# Patient Record
Sex: Female | Born: 1955 | Race: Black or African American | Hispanic: No | Smoking: Never smoker
Health system: Southern US, Community
[De-identification: ages and names within clinical notes are randomized; demographics above are authoritative.]

## PROBLEM LIST (undated history)

## (undated) DIAGNOSIS — E785 Hyperlipidemia, unspecified: Secondary | ICD-10-CM

## (undated) DIAGNOSIS — N390 Urinary tract infection, site not specified: Secondary | ICD-10-CM

## (undated) DIAGNOSIS — B019 Varicella without complication: Secondary | ICD-10-CM

## (undated) DIAGNOSIS — F32A Depression, unspecified: Secondary | ICD-10-CM

## (undated) DIAGNOSIS — F329 Major depressive disorder, single episode, unspecified: Secondary | ICD-10-CM

## (undated) HISTORY — DX: Hyperlipidemia, unspecified: E78.5

## (undated) HISTORY — DX: Urinary tract infection, site not specified: N39.0

## (undated) HISTORY — DX: Varicella without complication: B01.9

## (undated) HISTORY — DX: Depression, unspecified: F32.A

## (undated) HISTORY — PX: MYOMECTOMY: SHX85

## (undated) HISTORY — DX: Major depressive disorder, single episode, unspecified: F32.9

---

## 2015-07-05 DIAGNOSIS — J301 Allergic rhinitis due to pollen: Secondary | ICD-10-CM | POA: Insufficient documentation

## 2016-08-22 DIAGNOSIS — G4700 Insomnia, unspecified: Secondary | ICD-10-CM | POA: Insufficient documentation

## 2016-12-05 ENCOUNTER — Encounter: Payer: Self-pay | Admitting: Family Medicine

## 2016-12-11 ENCOUNTER — Ambulatory Visit: Payer: Self-pay | Admitting: Family Medicine

## 2016-12-13 ENCOUNTER — Encounter: Payer: Self-pay | Admitting: Family Medicine

## 2016-12-13 ENCOUNTER — Ambulatory Visit: Payer: Self-pay | Admitting: Family Medicine

## 2016-12-13 ENCOUNTER — Ambulatory Visit: Payer: BLUE CROSS/BLUE SHIELD | Admitting: Family Medicine

## 2016-12-13 VITALS — BP 128/80 | HR 71 | Temp 97.7°F | Ht 66.0 in | Wt 159.1 lb

## 2016-12-13 DIAGNOSIS — Z0000 Encounter for general adult medical examination without abnormal findings: Secondary | ICD-10-CM | POA: Diagnosis not present

## 2016-12-13 DIAGNOSIS — Z1231 Encounter for screening mammogram for malignant neoplasm of breast: Secondary | ICD-10-CM | POA: Diagnosis not present

## 2016-12-13 DIAGNOSIS — J3089 Other allergic rhinitis: Secondary | ICD-10-CM

## 2016-12-13 DIAGNOSIS — Z1239 Encounter for other screening for malignant neoplasm of breast: Secondary | ICD-10-CM

## 2016-12-13 DIAGNOSIS — F320 Major depressive disorder, single episode, mild: Secondary | ICD-10-CM | POA: Insufficient documentation

## 2016-12-13 DIAGNOSIS — Z23 Encounter for immunization: Secondary | ICD-10-CM | POA: Insufficient documentation

## 2016-12-13 DIAGNOSIS — E559 Vitamin D deficiency, unspecified: Secondary | ICD-10-CM | POA: Diagnosis not present

## 2016-12-13 DIAGNOSIS — G4709 Other insomnia: Secondary | ICD-10-CM | POA: Insufficient documentation

## 2016-12-13 DIAGNOSIS — Z1211 Encounter for screening for malignant neoplasm of colon: Secondary | ICD-10-CM | POA: Insufficient documentation

## 2016-12-13 DIAGNOSIS — J309 Allergic rhinitis, unspecified: Secondary | ICD-10-CM | POA: Insufficient documentation

## 2016-12-13 LAB — URINALYSIS, ROUTINE W REFLEX MICROSCOPIC
Bilirubin Urine: NEGATIVE
Hgb urine dipstick: NEGATIVE
Ketones, ur: NEGATIVE
Leukocytes, UA: NEGATIVE
Nitrite: NEGATIVE
RBC / HPF: NONE SEEN (ref 0–?)
SPECIFIC GRAVITY, URINE: 1.015 (ref 1.000–1.030)
TOTAL PROTEIN, URINE-UPE24: NEGATIVE
Urine Glucose: NEGATIVE
Urobilinogen, UA: 0.2 (ref 0.0–1.0)
WBC, UA: NONE SEEN (ref 0–?)
pH: 7.5 (ref 5.0–8.0)

## 2016-12-13 LAB — CBC
HEMATOCRIT: 43.8 % (ref 36.0–46.0)
Hemoglobin: 14.3 g/dL (ref 12.0–15.0)
MCHC: 32.6 g/dL (ref 30.0–36.0)
MCV: 92.3 fl (ref 78.0–100.0)
Platelets: 232 10*3/uL (ref 150.0–400.0)
RBC: 4.74 Mil/uL (ref 3.87–5.11)
RDW: 13.3 % (ref 11.5–15.5)
WBC: 3.9 10*3/uL — AB (ref 4.0–10.5)

## 2016-12-13 LAB — COMPREHENSIVE METABOLIC PANEL
ALT: 18 U/L (ref 0–35)
AST: 21 U/L (ref 0–37)
Albumin: 4.4 g/dL (ref 3.5–5.2)
Alkaline Phosphatase: 113 U/L (ref 39–117)
BUN: 12 mg/dL (ref 6–23)
CHLORIDE: 104 meq/L (ref 96–112)
CO2: 28 meq/L (ref 19–32)
Calcium: 11 mg/dL — ABNORMAL HIGH (ref 8.4–10.5)
Creatinine, Ser: 0.65 mg/dL (ref 0.40–1.20)
GFR: 118.75 mL/min (ref >60.00)
GLUCOSE: 95 mg/dL (ref 70–99)
POTASSIUM: 4.3 meq/L (ref 3.5–5.1)
SODIUM: 139 meq/L (ref 135–145)
TOTAL PROTEIN: 7.4 g/dL (ref 6.0–8.3)
Total Bilirubin: 0.8 mg/dL (ref 0.2–1.2)

## 2016-12-13 LAB — LIPID PANEL
Cholesterol: 160 mg/dL (ref 0–200)
HDL: 45 mg/dL (ref >39.00)
LDL CALC: 95 mg/dL (ref 0–99)
NONHDL: 115.29
Total CHOL/HDL Ratio: 4
Triglycerides: 99 mg/dL (ref 0.0–149.0)
VLDL: 19.8 mg/dL (ref 0.0–40.0)

## 2016-12-13 LAB — VITAMIN D 25 HYDROXY (VIT D DEFICIENCY, FRACTURES): VITD: 22.94 ng/mL — AB (ref 30.00–100.00)

## 2016-12-13 LAB — TSH: TSH: 2.07 u[IU]/mL (ref 0.35–4.50)

## 2016-12-13 MED ORDER — PAROXETINE HCL 10 MG PO TABS: ORAL_TABLET | ORAL | 1 refills | Status: DC

## 2016-12-13 MED ORDER — TRAZODONE HCL 100 MG PO TABS: ORAL_TABLET | ORAL | 1 refills | Status: DC

## 2016-12-13 NOTE — Addendum Note (Signed)
Addended by: Kateri Mc E on: 12/13/2016 03:51 PM   Modules accepted: Orders

## 2016-12-13 NOTE — Progress Notes (Addendum)
Subjective:  Patient ID: Vicki George, female    DOB: 30-Jun-1955  Age: 61 y.o. MRN: [id number]  CC: [place] presents today to establish care and for follow-up of her insomnia and allergic rhinitis.  She continues to have sneezing, itchy watery eyes, and postnasal drip.  She had visited her her sister up [place] a few weeks ago and found relief from an over-the-counter product.  She is a Regulatory affairs officer.  There is a lot of cough in her house but it is located mostly in her basement.  She has mattress covers.  She also continues to suffer insomnia.  She tells of some difficulty going to sleep and staying asleep.  She also admits to sadness.  Her energy level has been affected.  Her work is not been affected.  Friends have suggested that she seek help.  She denies any thoughts of self-harm.  She lives alone.  She does not smoke drink alcohol or use illicit drugs.  She does not have a faith community at this time.  Her only son is a Administrator, arts at [employer].  He is studying Librarian, academic.  She expresses concern about his apparent lack of motivation but he told her that he is making A's and B's.  He refuses to get his driver's license.  She is in need of a Pap smear, colonoscopy, and mammogram.  She is fasting today.  See also PHQ 9 obtained today.  HPI Vicki George presents for  History Georgian has no past medical history on file.   She has no past surgical history on file.   Her family history is not on file.She reports that  has never smoked. she has never used smokeless tobacco. She reports that she does not drink alcohol or use drugs.  No outpatient medications prior to visit.   No facility-administered medications prior to visit.     ROS Review of Systems  Constitutional: Negative.   HENT: Positive for postnasal drip, rhinorrhea and sneezing. Negative for congestion, ear pain, hearing loss, nosebleeds, sore throat, tinnitus, trouble swallowing and voice change.   Eyes: Positive for  itching. Negative for pain and visual disturbance.  Respiratory: Negative.   Cardiovascular: Negative.   Gastrointestinal: Negative.   Endocrine: Negative for polyphagia and polyuria.  Genitourinary: Negative.   Musculoskeletal: Negative.   Skin: Negative for color change and rash.  Allergic/Immunologic: Positive for environmental allergies.  Neurological: Negative.   Hematological: Does not bruise/bleed easily.  Psychiatric/Behavioral: Positive for dysphoric mood and sleep disturbance. Negative for agitation, behavioral problems, confusion, self-injury and suicidal ideas. The patient is nervous/anxious.     Objective:  BP 128/80 (BP Location: Left Arm, Patient Position: Sitting, Cuff Size: Normal)   Pulse 71   Temp 97.7 F (36.5 C) (Oral)   Ht 5\' 6"  (1.676 m)   Wt 159 lb 2 oz (72.2 kg)   SpO2 98%   BMI 25.68 kg/m   Physical Exam  Constitutional: She is oriented to person, place, and time. She appears well-developed and well-nourished. No distress.  HENT:  Head: Normocephalic and atraumatic.  Right Ear: External ear normal.  Left Ear: External ear normal.  Mouth/Throat: Oropharynx is clear and moist. No oropharyngeal exudate.  Eyes: Conjunctivae are normal. Pupils are equal, round, and reactive to light. Right eye exhibits no discharge. Left eye exhibits no discharge. No scleral icterus.  Neck: Neck supple. No JVD present. No tracheal deviation present. No thyromegaly present.  Cardiovascular: Normal rate, regular rhythm and normal heart sounds.  Pulmonary/Chest:  Effort normal and breath sounds normal. No stridor.  Abdominal: Bowel sounds are normal.  Musculoskeletal: She exhibits no edema or tenderness.  Lymphadenopathy:    She has no cervical adenopathy.  Neurological: She is alert and oriented to person, place, and time.  Skin: Skin is warm and dry. She is not diaphoretic.  Psychiatric: She has a normal mood and affect. Her behavior is normal.      Assessment & Plan:    Zhania was seen today for establish care.  Diagnoses and all orders for this visit:  Depression, major, single episode, mild (HCC) -     TSH -     PARoxetine (PAXIL) 10 MG tablet; Take one daily for one week and then take 2 -     traZODone (DESYREL) 100 MG tablet; Take 1/2 to 1 at night as needed for sleep.  Other insomnia -     PARoxetine (PAXIL) 10 MG tablet; Take one daily for one week and then take 2 -     traZODone (DESYREL) 100 MG tablet; Take 1/2 to 1 at night as needed for sleep.  Healthcare maintenance -     CBC -     Comprehensive metabolic panel -     Hepatitis C antibody -     HIV antibody -     Lipid panel -     TSH -     Urinalysis, Routine w reflex microscopic -     VITAMIN D 25 Hydroxy (Vit-D Deficiency, Fractures)  Screening for breast cancer -     Cancel: MM Digital Screening; Future -     MM Digital Screening; Future  Need for influenza vaccination -     Flu Vaccine QUAD 36+ mos IM  Annual physical exam  Non-seasonal allergic rhinitis due to other allergic trigger  Vitamin D deficiency -     Vitamin D, Ergocalciferol, (DRISDOL) 50000 units CAPS capsule; Take 1 capsule (50,000 Units total) by mouth every 7 (seven) days.   I am having [facility] start on PARoxetine, traZODone, and Vitamin D (Ergocalciferol).  Meds ordered this encounter  Medications  . PARoxetine (PAXIL) 10 MG tablet    Sig: Take one daily for one week and then take 2    Dispense:  60 tablet    Refill:  1  . traZODone (DESYREL) 100 MG tablet    Sig: Take 1/2 to 1 at night as needed for sleep.    Dispense:  60 tablet    Refill:  1  . Vitamin D, Ergocalciferol, (DRISDOL) 50000 units CAPS capsule    Sig: Take 1 capsule (50,000 Units total) by mouth every 7 (seven) days.    Dispense:  30 capsule    Refill:  1     Follow-up: Return in about 6 weeks (around 01/24/2017).  Libby Maw, MD

## 2016-12-14 ENCOUNTER — Encounter: Payer: Self-pay | Admitting: Family Medicine

## 2016-12-14 DIAGNOSIS — E559 Vitamin D deficiency, unspecified: Secondary | ICD-10-CM | POA: Insufficient documentation

## 2016-12-14 LAB — HEPATITIS C ANTIBODY
Hepatitis C Ab: NONREACTIVE
SIGNAL TO CUT-OFF: 0.02 (ref <1.00)

## 2016-12-14 LAB — HIV ANTIBODY (ROUTINE TESTING W REFLEX): HIV 1&2 Ab, 4th Generation: NONREACTIVE

## 2016-12-14 MED ORDER — VITAMIN D (ERGOCALCIFEROL) 1.25 MG (50000 UNIT) PO CAPS: 50000.0000 [IU] | ORAL_CAPSULE | ORAL | 1 refills | Status: DC

## 2016-12-14 NOTE — Addendum Note (Signed)
Addended by: Jon Billings on: 12/14/2016 08:09 AM   Modules accepted: Orders

## 2017-02-26 ENCOUNTER — Ambulatory Visit: Payer: BLUE CROSS/BLUE SHIELD | Admitting: Family Medicine

## 2017-02-26 DIAGNOSIS — Z0289 Encounter for other administrative examinations: Secondary | ICD-10-CM

## 2017-03-23 ENCOUNTER — Ambulatory Visit: Payer: BLUE CROSS/BLUE SHIELD | Admitting: Family Medicine

## 2017-03-23 ENCOUNTER — Encounter: Payer: Self-pay | Admitting: Family Medicine

## 2017-03-23 VITALS — BP 120/80 | HR 84 | Ht 66.0 in | Wt 160.6 lb

## 2017-03-23 DIAGNOSIS — G4700 Insomnia, unspecified: Secondary | ICD-10-CM | POA: Diagnosis not present

## 2017-03-23 DIAGNOSIS — F320 Major depressive disorder, single episode, mild: Secondary | ICD-10-CM | POA: Diagnosis not present

## 2017-03-23 MED ORDER — PAROXETINE HCL 20 MG PO TABS: 20.0000 mg | ORAL_TABLET | Freq: Every day | ORAL | 1 refills | Status: DC

## 2017-03-23 NOTE — Progress Notes (Addendum)
Subjective:  Patient ID: Vicki George, female    DOB: 10-May-1955  Age: 62 y.o. MRN: [id number]  CC: Follow-up   HPI Vicki George presents for patient feels much better having taking the Paxil.  Has been lifted.  And she is definitely interested in continuing it.  Anxiety issues and sleep are much better.  She recently did start the vitamin D supplementation.  Unfortunately she lost a younger brother back in her home country but had an MVA involving an 69 wheeler.  Outpatient Medications Prior to Visit  Medication Sig Dispense Refill  . Vitamin D, Ergocalciferol, (DRISDOL) 50000 units CAPS capsule Take 1 capsule (50,000 Units total) by mouth every 7 (seven) days. 30 capsule 1  . PARoxetine (PAXIL) 10 MG tablet Take one daily for one week and then take 2 60 tablet 1  . traZODone (DESYREL) 100 MG tablet Take 1/2 to 1 at night as needed for sleep. (Patient not taking: Reported on 03/23/2017) 60 tablet 1   No facility-administered medications prior to visit.     ROS Review of Systems  Constitutional: Negative.   Respiratory: Negative.   Cardiovascular: Negative.   Gastrointestinal: Negative.   Neurological: Negative.   Hematological: Negative.   Psychiatric/Behavioral: Negative for behavioral problems, self-injury and suicidal ideas.    Objective:  BP 120/80 (BP Location: Left Arm, Patient Position: Sitting, Cuff Size: Normal)   Pulse 84   Ht 5\' 6"  (1.676 m)   Wt 160 lb 9.6 oz (72.8 kg)   BMI 25.92 kg/m   BP Readings from Last 3 Encounters:  09/07/17 110/70  03/23/17 120/80  12/13/16 128/80    Wt Readings from Last 3 Encounters:  09/07/17 161 lb 2 oz (73.1 kg)  03/23/17 160 lb 9.6 oz (72.8 kg)  12/13/16 159 lb 2 oz (72.2 kg)    Physical Exam  Constitutional: She is oriented to person, place, and time. She appears well-nourished. No distress.  HENT:  Head: Normocephalic and atraumatic.  Right Ear: External ear normal.  Left Ear: External ear normal.  Pulmonary/Chest:  Effort normal.  Neurological: She is alert and oriented to person, place, and time.  Skin: Skin is warm and dry. She is not diaphoretic.  Psychiatric: She has a normal mood and affect. Her behavior is normal.    Lab Results  Component Value Date   WBC 3.9 (L) 12/13/2016   HGB 14.3 12/13/2016   HCT 43.8 12/13/2016   PLT 232.0 12/13/2016   GLUCOSE 95 12/13/2016   CHOL 160 12/13/2016   TRIG 99.0 12/13/2016   HDL 45.00 12/13/2016   LDLCALC 95 12/13/2016   ALT 18 12/13/2016   AST 21 12/13/2016   NA 139 12/13/2016   K 4.3 12/13/2016   CL 104 12/13/2016   CREATININE 0.65 12/13/2016   BUN 12 12/13/2016   CO2 28 12/13/2016   TSH 2.07 12/13/2016    Patient was never admitted.  Assessment & Plan:   Vicki George was seen today for follow-up.  Diagnoses and all orders for this visit:  Depression, major, single episode, mild (HCC) -     Discontinue: PARoxetine (PAXIL) 20 MG tablet; Take 1 tablet (20 mg total) by mouth daily.   I have discontinued Vicki George's PARoxetine. I am also having her maintain her Vitamin D (Ergocalciferol).  Meds ordered this encounter  Medications  . DISCONTD: PARoxetine (PAXIL) 20 MG tablet    Sig: Take 1 tablet (20 mg total) by mouth daily.    Dispense:  90 tablet  Refill:  1     Follow-up: Return in about 6 months (around 09/23/2017).  Libby Maw, MD

## 2017-09-07 ENCOUNTER — Telehealth: Payer: Self-pay | Admitting: Family Medicine

## 2017-09-07 ENCOUNTER — Ambulatory Visit: Payer: BLUE CROSS/BLUE SHIELD | Admitting: Family Medicine

## 2017-09-07 ENCOUNTER — Encounter: Payer: Self-pay | Admitting: Family Medicine

## 2017-09-07 VITALS — BP 110/70 | HR 84 | Ht 66.0 in | Wt 161.1 lb

## 2017-09-07 DIAGNOSIS — F320 Major depressive disorder, single episode, mild: Secondary | ICD-10-CM

## 2017-09-07 DIAGNOSIS — G4709 Other insomnia: Secondary | ICD-10-CM

## 2017-09-07 DIAGNOSIS — J301 Allergic rhinitis due to pollen: Secondary | ICD-10-CM

## 2017-09-07 DIAGNOSIS — J3089 Other allergic rhinitis: Secondary | ICD-10-CM

## 2017-09-07 DIAGNOSIS — E559 Vitamin D deficiency, unspecified: Secondary | ICD-10-CM

## 2017-09-07 DIAGNOSIS — Z23 Encounter for immunization: Secondary | ICD-10-CM

## 2017-09-07 DIAGNOSIS — Z0000 Encounter for general adult medical examination without abnormal findings: Secondary | ICD-10-CM

## 2017-09-07 LAB — VITAMIN D 25 HYDROXY (VIT D DEFICIENCY, FRACTURES): VITD: 39.48 ng/mL (ref 30.00–100.00)

## 2017-09-07 MED ORDER — PAROXETINE HCL 20 MG PO TABS: 20.0000 mg | ORAL_TABLET | Freq: Every day | ORAL | 1 refills | Status: DC

## 2017-09-07 MED ORDER — FLUTICASONE PROPIONATE 50 MCG/ACT NA SUSP: 2.0000 | Freq: Every day | NASAL | 6 refills | Status: DC

## 2017-09-07 NOTE — Patient Instructions (Signed)
Allergic Rhinitis, Adult Allergic rhinitis is an allergic reaction that affects the mucous membrane inside the nose. It causes sneezing, a runny or stuffy nose, and the feeling of mucus going down the back of the throat (postnasal drip). Allergic rhinitis can be mild to severe. There are two types of allergic rhinitis:  Seasonal. This type is also called hay fever. It happens only during certain seasons.  Perennial. This type can happen at any time of the year.  What are the causes? This condition happens when the body's defense system (immune system) responds to certain harmless substances called allergens as though they were germs.  Seasonal allergic rhinitis is triggered by pollen, which can come from grasses, trees, and weeds. Perennial allergic rhinitis may be caused by:  House dust mites.  Pet dander.  Mold spores.  What are the signs or symptoms? Symptoms of this condition include:  Sneezing.  Runny or stuffy nose (nasal congestion).  Postnasal drip.  Itchy nose.  Tearing of the eyes.  Trouble sleeping.  Daytime sleepiness.  How is this diagnosed? This condition may be diagnosed based on:  Your medical history.  A physical exam.  Tests to check for related conditions, such as: ? Asthma. ? Pink eye. ? Ear infection. ? Upper respiratory infection.  Tests to find out which allergens trigger your symptoms. These may include skin or blood tests.  How is this treated? There is no cure for this condition, but treatment can help control symptoms. Treatment may include:  Taking medicines that block allergy symptoms, such as antihistamines. Medicine may be given as a shot, nasal spray, or pill.  Avoiding the allergen.  Desensitization. This treatment involves getting ongoing shots until your body becomes less sensitive to the allergen. This treatment may be done if other treatments do not help.  If taking medicine and avoiding the allergen does not work, new,  stronger medicines may be prescribed.  Follow these instructions at home:  Find out what you are allergic to. Common allergens include smoke, dust, and pollen.  Avoid the things you are allergic to. These are some things you can do to help avoid allergens: ? Replace carpet with wood, tile, or vinyl flooring. Carpet can trap dander and dust. ? Do not smoke. Do not allow smoking in your home. ? Change your heating and air conditioning filter at least once a month. ? During allergy season:  Keep windows closed as much as possible.  Plan outdoor activities when pollen counts are lowest. This is usually during the evening hours.  When coming indoors, change clothing and shower before sitting on furniture or bedding.  Take over-the-counter and prescription medicines only as told by your health care provider.  Keep all follow-up visits as told by your health care provider. This is important. Contact a health care provider if:  You have a fever.  You develop a persistent cough.  You make whistling sounds when you breathe (you wheeze).  Your symptoms interfere with your normal daily activities. Get help right away if:  You have shortness of breath. Summary  This condition can be managed by taking medicines as directed and avoiding allergens.  Contact your health care provider if you develop a persistent cough or fever.  During allergy season, keep windows closed as much as possible. This information is not intended to replace advice given to you by your health care provider. Make sure you discuss any questions you have with your health care provider. Document Released: 09/13/2000 Document Revised: 01/27/2016  Document Reviewed: 01/27/2016 Elsevier Interactive Patient Education  2018 [place] With Depression Everyone experiences occasional disappointment, sadness, and loss in their lives. When you are feeling down, blue, or sad for at least 2 weeks in a row, it may  mean that you have depression. Depression can affect your thoughts and feelings, relationships, daily activities, and physical health. It is caused by changes in the way your brain functions. If you receive a diagnosis of depression, your health care provider will tell you which type of depression you have and what treatment options are available to you. If you are living with depression, there are ways to help you recover from it and also ways to prevent it from coming back. How to cope with lifestyle changes Coping with stress Stress is your body's reaction to life changes and events, both good and bad. Stressful situations may include:  Getting married.  The death of a spouse.  Losing a job.  Retiring.  Having a baby.  Stress can last just a few hours or it can be ongoing. Stress can play a major role in depression, so it is important to learn both how to cope with stress and how to think about it differently. Talk with your health care provider or a counselor if you would like to learn more about stress reduction. He or she may suggest some stress reduction techniques, such as:  Music therapy. This can include creating music or listening to music. Choose music that you enjoy and that inspires you.  Mindfulness-based meditation. This kind of meditation can be done while sitting or walking. It involves being aware of your normal breaths, rather than trying to control your breathing.  Centering prayer. This is a kind of meditation that involves focusing on a spiritual word or phrase. Choose a word, phrase, or sacred image that is meaningful to you and that brings you peace.  Deep breathing. To do this, expand your stomach and inhale slowly through your nose. Hold your breath for 3-5 seconds, then exhale slowly, allowing your stomach muscles to relax.  Muscle relaxation. This involves intentionally tensing muscles then relaxing them.  Choose a stress reduction technique that fits your  lifestyle and personality. Stress reduction techniques take time and practice to develop. Set aside 5-15 minutes a day to do them. Therapists can offer training in these techniques. The training may be covered by some insurance plans. Other things you can do to manage stress include:  Keeping a stress diary. This can help you learn what triggers your stress and ways to control your response.  Understanding what your limits are and saying no to requests or events that lead to a schedule that is too full.  Thinking about how you respond to certain situations. You may not be able to control everything, but you can control how you react.  Adding humor to your life by watching funny films or TV shows.  Making time for activities that help you relax and not feeling guilty about spending your time this way.  Medicines Your health care provider may suggest certain medicines if he or she feels that they will help improve your condition. Avoid using alcohol and other substances that may prevent your medicines from working properly (may interact). It is also important to:  Talk with your pharmacist or health care provider about all the medicines that you take, their possible side effects, and what medicines are safe to take together.  Make it your goal to take part  in all treatment decisions (shared decision-making). This includes giving input on the side effects of medicines. It is best if shared decision-making with your health care provider is part of your total treatment plan.  If your health care provider prescribes a medicine, you may not notice the full benefits of it for 4-8 weeks. Most people who are treated for depression need to be on medicine for at least 6-12 months after they feel better. If you are taking medicines as part of your treatment, do not stop taking medicines without first talking to your health care provider. You may need to have the medicine slowly decreased (tapered) over time to  decrease the risk of harmful side effects. Relationships Your health care provider may suggest family therapy along with individual therapy and drug therapy. While there may not be family problems that are causing you to feel depressed, it is still important to make sure your family learns as much as they can about your mental health. Having your family's support can help make your treatment successful. How to recognize changes in your condition Everyone has a different response to treatment for depression. Recovery from major depression happens when you have not had signs of major depression for two months. This may mean that you will start to:  Have more interest in doing activities.  Feel less hopeless than you did 2 months ago.  Have more energy.  Overeat less often, or have better or improving appetite.  Have better concentration.  Your health care provider will work with you to decide the next steps in your recovery. It is also important to recognize when your condition is getting worse. Watch for these signs:  Having fatigue or low energy.  Eating too much or too little.  Sleeping too much or too little.  Feeling restless, agitated, or hopeless.  Having trouble concentrating or making decisions.  Having unexplained physical complaints.  Feeling irritable, angry, or aggressive.  Get help as soon as you or your family members notice these symptoms coming back. How to get support and help from others How to talk with friends and family members about your condition Talking to friends and family members about your condition can provide you with one way to get support and guidance. Reach out to trusted friends or family members, explain your symptoms to them, and let them know that you are working with a health care provider to treat your depression. Financial resources Not all insurance plans cover mental health care, so it is important to check with your insurance carrier. If  paying for co-pays or counseling services is a problem, search for a local or county mental health care center. They may be able to offer public mental health care services at low or no cost when you are not able to see a private health care provider. If you are taking medicine for depression, you may be able to get the generic form, which may be less expensive. Some makers of prescription medicines also offer help to patients who cannot afford the medicines they need. Follow these instructions at home:  Get the right amount and quality of sleep.  Cut down on using caffeine, tobacco, alcohol, and other potentially harmful substances.  Try to exercise, such as walking or lifting small weights.  Take over-the-counter and prescription medicines only as told by your health care provider.  Eat a healthy diet that includes plenty of vegetables, fruits, whole grains, low-fat dairy products, and lean protein. Do not eat a lot  of foods that are high in solid fats, added sugars, or salt.  Keep all follow-up visits as told by your health care provider. This is important. Contact a health care provider if:  You stop taking your antidepressant medicines, and you have any of these symptoms: ? Nausea. ? Headache. ? Feeling lightheaded. ? Chills and body aches. ? Not being able to sleep (insomnia).  You or your friends and family think your depression is getting worse. Get help right away if:  You have thoughts of hurting yourself or others. If you ever feel like you may hurt yourself or others, or have thoughts about taking your own life, get help right away. You can go to your nearest emergency department or call:  Your local emergency services (911 in the [place]  A suicide crisis helpline, such as the [place] at (708)401-3890. This is open 24-hours a day.  Summary  If you are living with depression, there are ways to help you recover from it and also ways to  prevent it from coming back.  Work with your health care team to create a management plan that includes counseling, stress management techniques, and healthy lifestyle habits. This information is not intended to replace advice given to you by your health care provider. Make sure you discuss any questions you have with your health care provider. Document Released: 11/22/2015 Document Revised: 11/22/2015 Document Reviewed: 11/22/2015 Elsevier Interactive Patient Education  [employer].

## 2017-09-07 NOTE — Telephone Encounter (Signed)
Sent a rx for flonase to pharmacy.

## 2017-09-07 NOTE — Addendum Note (Signed)
Addended by: Jon Billings on: 09/07/2017 03:38 PM   Modules accepted: Orders

## 2017-09-07 NOTE — Progress Notes (Signed)
Subjective:  Patient ID: Vicki George, female    DOB: 1955-11-15  Age: 62 y.o. MRN: [id number]  CC: Follow-up   HPI Vicki George presents for follow-up of her depression and vitamin D to deficiency.  Her depression is mostly lifted and she is tolerating the Paxil.  This December will be the anniversary of her brother's death.  Her 92 year old son did obtain his driver's license and is driving for himself now.  She has been taking the high-dose vitamin D tablet.  She has been enduring nasal congestion postnasal drip with sneezing.  She has tried Claritin with some benefit.  Outpatient Medications Prior to Visit  Medication Sig Dispense Refill  . Vitamin D, Ergocalciferol, (DRISDOL) 50000 units CAPS capsule Take 1 capsule (50,000 Units total) by mouth every 7 (seven) days. 30 capsule 1  . PARoxetine (PAXIL) 20 MG tablet Take 1 tablet (20 mg total) by mouth daily. 90 tablet 1  . traZODone (DESYREL) 100 MG tablet Take 1/2 to 1 at night as needed for sleep. (Patient not taking: Reported on 03/23/2017) 60 tablet 1   No facility-administered medications prior to visit.     ROS Review of Systems  Constitutional: Negative for chills, fatigue, fever and unexpected weight change.  HENT: Positive for congestion, postnasal drip, rhinorrhea and sneezing.   Eyes: Negative for photophobia and visual disturbance.  Respiratory: Negative.   Cardiovascular: Negative.   Gastrointestinal: Negative.   Endocrine: Negative for polyphagia and polyuria.  Skin: Negative for pallor and rash.  Allergic/Immunologic: Negative for immunocompromised state.  Neurological: Negative for weakness and headaches.  Hematological: Does not bruise/bleed easily.  Psychiatric/Behavioral: Negative.     Objective:  BP 110/70   Pulse 84   Ht 5\' 6"  (1.676 m)   Wt 161 lb 2 oz (73.1 kg)   SpO2 95%   BMI 26.01 kg/m   BP Readings from Last 3 Encounters:  09/07/17 110/70  03/23/17 120/80  12/13/16 128/80    Wt Readings  from Last 3 Encounters:  09/07/17 161 lb 2 oz (73.1 kg)  03/23/17 160 lb 9.6 oz (72.8 kg)  12/13/16 159 lb 2 oz (72.2 kg)    Physical Exam  Constitutional: She is oriented to person, place, and time. She appears well-developed and well-nourished. No distress.  HENT:  Head: Normocephalic and atraumatic.  Right Ear: External ear normal.  Left Ear: External ear normal.  Mouth/Throat: Oropharynx is clear and moist. No oropharyngeal exudate.  Eyes: Pupils are equal, round, and reactive to light. Conjunctivae and EOM are normal. Right eye exhibits no discharge. Left eye exhibits no discharge. No scleral icterus.  Neck: Neck supple. No JVD present. No tracheal deviation present. No thyromegaly present.  Cardiovascular: Normal rate, regular rhythm and normal heart sounds.  Pulmonary/Chest: Effort normal.  Neurological: She is alert and oriented to person, place, and time.  Skin: Skin is warm and dry. She is not diaphoretic.  Psychiatric: She has a normal mood and affect. Her behavior is normal.    Lab Results  Component Value Date   WBC 3.9 (L) 12/13/2016   HGB 14.3 12/13/2016   HCT 43.8 12/13/2016   PLT 232.0 12/13/2016   GLUCOSE 95 12/13/2016   CHOL 160 12/13/2016   TRIG 99.0 12/13/2016   HDL 45.00 12/13/2016   LDLCALC 95 12/13/2016   ALT 18 12/13/2016   AST 21 12/13/2016   NA 139 12/13/2016   K 4.3 12/13/2016   CL 104 12/13/2016   CREATININE 0.65 12/13/2016   BUN 12  12/13/2016   CO2 28 12/13/2016   TSH 2.07 12/13/2016    Patient was never admitted.  Assessment & Plan:   Vicki George was seen today for follow-up.  Diagnoses and all orders for this visit:  Non-seasonal allergic rhinitis due to other allergic trigger  Depression, major, single episode, mild (HCC) -     PARoxetine (PAXIL) 20 MG tablet; Take 1 tablet (20 mg total) by mouth daily.  Other insomnia  Vitamin D deficiency -     VITAMIN D 25 Hydroxy (Vit-D Deficiency, Fractures)  Healthcare maintenance -      Cologuard   I have discontinued Vicki George's traZODone. I am also having her maintain her Vitamin D (Ergocalciferol) and PARoxetine.  Meds ordered this encounter  Medications  . PARoxetine (PAXIL) 20 MG tablet    Sig: Take 1 tablet (20 mg total) by mouth daily.    Dispense:  90 tablet    Refill:  1  Patient doing well with the Paxil.  We discussed trying a slow taper after the first of the year after she passes the anniversary of her brother's death.  She was given information about Cologuard and will contact her insurance company to check on coverage.  Suggested that she use nasal steroid for her allergies she was given anticipatory guidance with regards to living with her depression as well as treating allergy rhinitis signs and symptoms.  Vitamin D levels are being rechecked today.   Follow-up: Return in about 6 months (around 03/08/2018).  Libby Maw, MD

## 2017-09-07 NOTE — Telephone Encounter (Signed)
Copied from [place] [id number]. Topic: Quick Communication - Rx Refill/Question >> Sep 07, 2017 11:40 AM Margot Ables wrote: Medication: nasal spray was not sent to the pharmacy per pt  Has the patient contacted their pharmacy? Yes - nothing received for allergies Preferred Pharmacy (with phone number or street name): [place] [phone] (Phone) [phone] (Fax)

## 2017-09-07 NOTE — Telephone Encounter (Signed)
Patient is aware 

## 2017-10-24 ENCOUNTER — Other Ambulatory Visit: Payer: Self-pay | Admitting: Family Medicine

## 2017-10-24 DIAGNOSIS — Z1231 Encounter for screening mammogram for malignant neoplasm of breast: Secondary | ICD-10-CM

## 2017-10-26 ENCOUNTER — Ambulatory Visit
Admission: RE | Admit: 2017-10-26 | Discharge: 2017-10-26 | Disposition: A | Discharge status: Home / Self Care | Payer: BLUE CROSS/BLUE SHIELD | Source: Ambulatory Visit | Attending: Family Medicine | Admitting: Family Medicine

## 2017-10-26 DIAGNOSIS — Z1231 Encounter for screening mammogram for malignant neoplasm of breast: Secondary | ICD-10-CM

## 2017-11-05 ENCOUNTER — Telehealth: Payer: Self-pay

## 2017-11-05 NOTE — Telephone Encounter (Signed)
Form filled out & faxed over to [facility]. Patient is aware.      Copied from [place] [id number]. Topic: General - Other >> Nov 05, 2017  9:19 AM Virl Axe D wrote: Reason for CRM: Pt stated she spoke with Dr. Ethelene Hal regarding a kit that she can use at home to test for different types of cancer. Dr. Ethelene Hal suggested pt check with insurance to see if they would cover this kit. She stated that they would cover 100% of testing. Pt would like to know how she can get a kit. Insurance stated this is something her PCP/PCP office could possibly order. Please advise pt.

## 2017-12-10 ENCOUNTER — Encounter: Payer: Self-pay | Admitting: Family Medicine

## 2017-12-11 LAB — COLOGUARD

## 2017-12-14 ENCOUNTER — Telehealth: Payer: Self-pay

## 2017-12-14 NOTE — Telephone Encounter (Signed)
I called and spoke with patient & let her know that her Cologuard was negative. Patient verbalized understanding.

## 2018-01-25 ENCOUNTER — Other Ambulatory Visit: Payer: Self-pay | Admitting: Family Medicine

## 2018-01-25 DIAGNOSIS — E559 Vitamin D deficiency, unspecified: Secondary | ICD-10-CM

## 2019-02-14 ENCOUNTER — Encounter: Payer: BLUE CROSS/BLUE SHIELD | Admitting: Family Medicine

## 2019-03-31 ENCOUNTER — Ambulatory Visit: Payer: Self-pay | Attending: Internal Medicine

## 2019-03-31 DIAGNOSIS — Z23 Encounter for immunization: Secondary | ICD-10-CM

## 2019-03-31 NOTE — Progress Notes (Signed)
   Covid-19 Vaccination Clinic  Name:  Vicki George    MRN: [id number] DOB: 02-01-1955  03/31/2019  Vicki George was observed post Covid-19 immunization for 15 minutes without incident. She was provided with Vaccine Information Sheet and instruction to access the V-Safe system.   Vicki George was instructed to call 911 with any severe reactions post vaccine: Marland Kitchen Difficulty breathing  . Swelling of face and throat  . A fast heartbeat  . A bad rash all over body  . Dizziness and weakness   Immunizations Administered    Name Date Dose VIS Date Route   Pfizer COVID-19 Vaccine 03/31/2019  2:16 PM 0.3 mL 12/13/2018 Intramuscular   Manufacturer: [place]   Lot: [id number]   [place]: [id number]

## 2019-04-15 ENCOUNTER — Other Ambulatory Visit: Payer: Self-pay

## 2019-04-16 ENCOUNTER — Encounter: Payer: Self-pay | Admitting: Family Medicine

## 2019-04-16 ENCOUNTER — Ambulatory Visit (INDEPENDENT_AMBULATORY_CARE_PROVIDER_SITE_OTHER): Payer: 59 | Admitting: Family Medicine

## 2019-04-16 VITALS — BP 136/78 | HR 84 | Temp 97.0°F | Ht 62.0 in | Wt 159.4 lb

## 2019-04-16 DIAGNOSIS — F341 Dysthymic disorder: Secondary | ICD-10-CM

## 2019-04-16 DIAGNOSIS — E559 Vitamin D deficiency, unspecified: Secondary | ICD-10-CM

## 2019-04-16 DIAGNOSIS — Z0000 Encounter for general adult medical examination without abnormal findings: Secondary | ICD-10-CM | POA: Diagnosis not present

## 2019-04-16 DIAGNOSIS — E213 Hyperparathyroidism, unspecified: Secondary | ICD-10-CM

## 2019-04-16 LAB — URINALYSIS, ROUTINE W REFLEX MICROSCOPIC
Bilirubin Urine: NEGATIVE
Ketones, ur: NEGATIVE
Leukocytes,Ua: NEGATIVE
Nitrite: NEGATIVE
RBC / HPF: NONE SEEN (ref 0–?)
Specific Gravity, Urine: 1.025 (ref 1.000–1.030)
Total Protein, Urine: NEGATIVE
Urine Glucose: NEGATIVE
Urobilinogen, UA: 0.2 (ref 0.0–1.0)
pH: 6.5 (ref 5.0–8.0)

## 2019-04-16 LAB — COMPREHENSIVE METABOLIC PANEL
ALT: 21 U/L (ref 0–35)
AST: 27 U/L (ref 0–37)
Albumin: 4.3 g/dL (ref 3.5–5.2)
Alkaline Phosphatase: 129 U/L — ABNORMAL HIGH (ref 39–117)
BUN: 18 mg/dL (ref 6–23)
CO2: 24 mEq/L (ref 19–32)
Calcium: 11.2 mg/dL — ABNORMAL HIGH (ref 8.4–10.5)
Chloride: 106 mEq/L (ref 96–112)
Creatinine, Ser: 0.73 mg/dL (ref 0.40–1.20)
GFR: 96.99 mL/min (ref >60.00)
Glucose, Bld: 105 mg/dL — ABNORMAL HIGH (ref 70–99)
Potassium: 4.6 mEq/L (ref 3.5–5.1)
Sodium: 137 mEq/L (ref 135–145)
Total Bilirubin: 1.3 mg/dL — ABNORMAL HIGH (ref 0.2–1.2)
Total Protein: 7.3 g/dL (ref 6.0–8.3)

## 2019-04-16 LAB — CBC
HCT: 42.3 % (ref 36.0–46.0)
Hemoglobin: 14.3 g/dL (ref 12.0–15.0)
MCHC: 33.9 g/dL (ref 30.0–36.0)
MCV: 89.4 fl (ref 78.0–100.0)
Platelets: 208 10*3/uL (ref 150.0–400.0)
RBC: 4.74 Mil/uL (ref 3.87–5.11)
RDW: 13.3 % (ref 11.5–15.5)
WBC: 4.5 10*3/uL (ref 4.0–10.5)

## 2019-04-16 LAB — LIPID PANEL
Cholesterol: 178 mg/dL (ref 0–200)
HDL: 39 mg/dL — ABNORMAL LOW (ref >39.00)
LDL Cholesterol: 123 mg/dL — ABNORMAL HIGH (ref 0–99)
NonHDL: 139.46
Total CHOL/HDL Ratio: 5
Triglycerides: 83 mg/dL (ref 0.0–149.0)
VLDL: 16.6 mg/dL (ref 0.0–40.0)

## 2019-04-16 MED ORDER — PAROXETINE HCL 10 MG PO TABS: 10.0000 mg | ORAL_TABLET | Freq: Every day | ORAL | 1 refills | Status: DC

## 2019-04-16 NOTE — Patient Instructions (Signed)
Health Maintenance for Postmenopausal Women Menopause is a normal process in which your ability to get pregnant comes to an end. This process happens slowly over many months or years, usually between the ages of 49 and 62. Menopause is complete when you have missed your menstrual periods for 12 months. It is important to talk with your health care provider about some of the most common conditions that affect women after menopause (postmenopausal women). These include heart disease, cancer, and bone loss (osteoporosis). Adopting a healthy lifestyle and getting preventive care can help to promote your health and wellness. The actions you take can also lower your chances of developing some of these common conditions. What should I know about menopause? During menopause, you may get a number of symptoms, such as:  Hot flashes. These can be moderate or severe.  Night sweats.  Decrease in sex drive.  Mood swings.  Headaches.  Tiredness.  Irritability.  Memory problems.  Insomnia. Choosing to treat or not to treat these symptoms is a decision that you make with your health care provider. Do I need hormone replacement therapy?  Hormone replacement therapy is effective in treating symptoms that are caused by menopause, such as hot flashes and night sweats.  Hormone replacement carries certain risks, especially as you become older. If you are thinking about using estrogen or estrogen with progestin, discuss the benefits and risks with your health care provider. What is my risk for heart disease and stroke? The risk of heart disease, heart attack, and stroke increases as you age. One of the causes may be a change in the body's hormones during menopause. This can affect how your body uses dietary fats, triglycerides, and cholesterol. Heart attack and stroke are medical emergencies. There are many things that you can do to help prevent heart disease and stroke. Watch your blood pressure  High  blood pressure causes heart disease and increases the risk of stroke. This is more likely to develop in people who have high blood pressure readings, are of African descent, or are overweight.  Have your blood pressure checked: ? Every 3-5 years if you are 39-10 years of age. ? Every year if you are 72 years old or older. Eat a healthy diet   Eat a diet that includes plenty of vegetables, fruits, low-fat dairy products, and lean protein.  Do not eat a lot of foods that are high in solid fats, added sugars, or sodium. Get regular exercise Get regular exercise. This is one of the most important things you can do for your health. Most adults should:  Try to exercise for at least 150 minutes each week. The exercise should increase your heart rate and make you sweat (moderate-intensity exercise).  Try to do strengthening exercises at least twice each week. Do these in addition to the moderate-intensity exercise.  Spend less time sitting. Even light physical activity can be beneficial. Other tips  Work with your health care provider to achieve or maintain a healthy weight.  Do not use any products that contain nicotine or tobacco, such as cigarettes, e-cigarettes, and chewing tobacco. If you need help quitting, ask your health care provider.  Know your numbers. Ask your health care provider to check your cholesterol and your blood sugar (glucose). Continue to have your blood tested as directed by your health care provider. Do I need screening for cancer? Depending on your health history and family history, you may need to have cancer screening at different stages of your life. This  may include screening for:  Breast cancer.  Cervical cancer.  Lung cancer.  Colorectal cancer. What is my risk for osteoporosis? After menopause, you may be at increased risk for osteoporosis. Osteoporosis is a condition in which bone destruction happens more quickly than new bone creation. To help prevent  osteoporosis or the bone fractures that can happen because of osteoporosis, you may take the following actions:  If you are 10-66 years old, get at least 1,000 mg of calcium and at least 600 mg of vitamin D per day.  If you are older than age 35 but younger than age 21, get at least 1,200 mg of calcium and at least 600 mg of vitamin D per day.  If you are older than age 27, get at least 1,200 mg of calcium and at least 800 mg of vitamin D per day. Smoking and drinking excessive alcohol increase the risk of osteoporosis. Eat foods that are rich in calcium and vitamin D, and do weight-bearing exercises several times each week as directed by your health care provider. How does menopause affect my mental health? Depression may occur at any age, but it is more common as you become older. Common symptoms of depression include:  Low or sad mood.  Changes in sleep patterns.  Changes in appetite or eating patterns.  Feeling an overall lack of motivation or enjoyment of activities that you previously enjoyed.  Frequent crying spells. Talk with your health care provider if you think that you are experiencing depression. General instructions See your health care provider for regular wellness exams and vaccines. This may include:  Scheduling regular health, dental, and eye exams.  Getting and maintaining your vaccines. These include: ? Influenza vaccine. Get this vaccine each year before the flu season begins. ? Pneumonia vaccine. ? Shingles vaccine. ? Tetanus, diphtheria, and pertussis (Tdap) booster vaccine. Your health care provider may also recommend other immunizations. Tell your health care provider if you have ever been abused or do not feel safe at home. Summary  Menopause is a normal process in which your ability to get pregnant comes to an end.  This condition causes hot flashes, night sweats, decreased interest in sex, mood swings, headaches, or lack of sleep.  Treatment for this  condition may include hormone replacement therapy.  Take actions to keep yourself healthy, including exercising regularly, eating a healthy diet, watching your weight, and checking your blood pressure and blood sugar levels.  Get screened for cancer and depression. Make sure that you are up to date with all your vaccines. This information is not intended to replace advice given to you by your health care provider. Make sure you discuss any questions you have with your health care provider. Document Revised: 12/12/2017 Document Reviewed: 12/12/2017 Elsevier Patient Education  2020 [employer]  Preventive Care 80-25 Years Old, Female Preventive care refers to visits with your health care provider and lifestyle choices that can promote health and wellness. This includes:  A yearly physical exam. This may also be called an annual well check.  Regular dental visits and eye exams.  Immunizations.  Screening for certain conditions.  Healthy lifestyle choices, such as eating a healthy diet, getting regular exercise, not using drugs or products that contain nicotine and tobacco, and limiting alcohol use. What can I expect for my preventive care visit? Physical exam Your health care provider will check your:  Height and weight. This may be used to calculate body mass index (BMI), which tells if you are  at a healthy weight.  Heart rate and blood pressure.  Skin for abnormal spots. Counseling Your health care provider may ask you questions about your:  Alcohol, tobacco, and drug use.  Emotional well-being.  Home and relationship well-being.  Sexual activity.  Eating habits.  Work and work Statistician.  Method of birth control.  Menstrual cycle.  Pregnancy history. What immunizations do I need?  Influenza (flu) vaccine  This is recommended every year. Tetanus, diphtheria, and pertussis (Tdap) vaccine  You may need a Td booster every 10 years. Varicella (chickenpox)  vaccine  You may need this if you have not been vaccinated. Zoster (shingles) vaccine  You may need this after age 37. Measles, mumps, and rubella (MMR) vaccine  You may need at least one dose of MMR if you were born in 1957 or later. You may also need a second dose. Pneumococcal conjugate (PCV13) vaccine  You may need this if you have certain conditions and were not previously vaccinated. Pneumococcal polysaccharide (PPSV23) vaccine  You may need one or two doses if you smoke cigarettes or if you have certain conditions. Meningococcal conjugate (MenACWY) vaccine  You may need this if you have certain conditions. Hepatitis A vaccine  You may need this if you have certain conditions or if you travel or work in places where you may be exposed to hepatitis A. Hepatitis B vaccine  You may need this if you have certain conditions or if you travel or work in places where you may be exposed to hepatitis B. Haemophilus influenzae type b (Hib) vaccine  You may need this if you have certain conditions. Human papillomavirus (HPV) vaccine  If recommended by your health care provider, you may need three doses over 6 months. You may receive vaccines as individual doses or as more than one vaccine together in one shot (combination vaccines). Talk with your health care provider about the risks and benefits of combination vaccines. What tests do I need? Blood tests  Lipid and cholesterol levels. These may be checked every 5 years, or more frequently if you are over 46 years old.  Hepatitis C test.  Hepatitis B test. Screening  Lung cancer screening. You may have this screening every year starting at age 69 if you have a 30-pack-year history of smoking and currently smoke or have quit within the past 15 years.  Colorectal cancer screening. All adults should have this screening starting at age 8 and continuing until age 69. Your health care provider may recommend screening at age 45 if you  are at increased risk. You will have tests every 1-10 years, depending on your results and the type of screening test.  Diabetes screening. This is done by checking your blood sugar (glucose) after you have not eaten for a while (fasting). You may have this done every 1-3 years.  Mammogram. This may be done every 1-2 years. Talk with your health care provider about when you should start having regular mammograms. This may depend on whether you have a family history of breast cancer.  BRCA-related cancer screening. This may be done if you have a family history of breast, ovarian, tubal, or peritoneal cancers.  Pelvic exam and Pap test. This may be done every 3 years starting at age 82. Starting at age 33, this may be done every 5 years if you have a Pap test in combination with an HPV test. Other tests  Sexually transmitted disease (STD) testing.  Bone density scan. This is done to screen  for osteoporosis. You may have this scan if you are at high risk for osteoporosis. Follow these instructions at home: Eating and drinking  Eat a diet that includes fresh fruits and vegetables, whole grains, lean protein, and low-fat dairy.  Take vitamin and mineral supplements as recommended by your health care provider.  Do not drink alcohol if: ? Your health care provider tells you not to drink. ? You are pregnant, may be pregnant, or are planning to become pregnant.  If you drink alcohol: ? Limit how much you have to 0-1 drink a day. ? Be aware of how much alcohol is in your drink. In the [place] one drink equals one 12 oz bottle of beer (355 mL), one 5 oz glass of wine (148 mL), or one 1 oz glass of hard liquor (44 mL). Lifestyle  Take daily care of your teeth and gums.  Stay active. Exercise for at least 30 minutes on 5 or more days each week.  Do not use any products that contain nicotine or tobacco, such as cigarettes, e-cigarettes, and chewing tobacco. If you need help quitting, ask your  health care provider.  If you are sexually active, practice safe sex. Use a condom or other form of birth control (contraception) in order to prevent pregnancy and STIs (sexually transmitted infections).  If told by your health care provider, take low-dose aspirin daily starting at age 65. What's next?  Visit your health care provider once a year for a well check visit.  Ask your health care provider how often you should have your eyes and teeth checked.  Stay up to date on all vaccines. This information is not intended to replace advice given to you by your health care provider. Make sure you discuss any questions you have with your health care provider. Document Revised: 08/30/2017 Document Reviewed: 08/30/2017 Elsevier Patient Education  2020 [employer].

## 2019-04-16 NOTE — Progress Notes (Signed)
Established Patient Office Visit  Subjective:  Patient ID: Vicki George, female    DOB: 1955/11/14  Age: 64 y.o. MRN: [id number]  CC:  Chief Complaint  Patient presents with  . Annual Exam    CPE, no concerns.     HPI Vicki George presents for follow-up of her dysthymia and a physical exam.  Insurance coverage did changed in she was lost to follow-up.  She had stopped the Paxil but is interested in restarting it at a lower dose.  It did help to rest at night.  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  Has gained some weight with the pandemic.  She is status post Covid vaccine.  She was able to access dental care.  Has not had a Pap in years.  Her mom died in the back in 10/03/22 and she was able to travel travel to [place] for the funeral.   Past Medical History:  Diagnosis Date  . Chicken pox   . Depression   . Urinary tract infection     No past surgical history on file.  No family history on file.  Social History   Socioeconomic History  . Marital status: Widowed    Spouse name: Not on file  . Number of children: Not on file  . Years of education: Not on file  . Highest education level: Not on file  Occupational History  . Not on file  Tobacco Use  . Smoking status: Never Smoker  . Smokeless tobacco: Never Used  Substance and Sexual Activity  . Alcohol use: No  . Drug use: No  . Sexual activity: Not on file  Other Topics Concern  . Not on file  Social History Narrative  . Not on file   Social Determinants of Health   Financial Resource Strain:   . Difficulty of Paying Living Expenses:   Food Insecurity:   . Worried About Charity fundraiser in the Last Year:   . Arboriculturist in the Last Year:   Transportation Needs:   . Film/video editor (Medical):   Marland Kitchen Lack of Transportation (Non-Medical):   Physical Activity:   . Days of Exercise per Week:   . Minutes of Exercise per Session:   Stress:   . Feeling of Stress :   Social Connections:   . Frequency of  Communication with Friends and Family:   . Frequency of Social Gatherings with Friends and Family:   . Attends Religious Services:   . Active Member of Clubs or Organizations:   . Attends Archivist Meetings:   Marland Kitchen Marital Status:   Intimate Partner Violence:   . Fear of Current or Ex-Partner:   . Emotionally Abused:   Marland Kitchen Physically Abused:   . Sexually Abused:     Outpatient Medications Prior to Visit  Medication Sig Dispense Refill  . fluticasone (FLONASE) 50 MCG/ACT nasal spray Place 2 sprays into both nostrils daily. (Patient not taking: Reported on 04/16/2019) 16 g 6  . Vitamin D, Ergocalciferol, (DRISDOL) 1.25 MG (50000 UT) CAPS capsule TAKE 1 CAPSULE BY MOUTH ONCE A WEEK (EVERY  7  DAYS) (Patient not taking: Reported on 04/16/2019) 12 capsule 0  . PARoxetine (PAXIL) 20 MG tablet Take 1 tablet (20 mg total) by mouth daily. (Patient not taking: Reported on 04/16/2019) 90 tablet 1   No facility-administered medications prior to visit.    Allergies  Allergen Reactions  . Aspirin Other (See Comments)    ROS Review of  Systems  Constitutional: Negative.   HENT: Negative.   Eyes: Negative for photophobia and visual disturbance.  Respiratory: Negative.   Cardiovascular: Negative.   Gastrointestinal: Negative.   Genitourinary: Negative.   Musculoskeletal: Negative for gait problem and joint swelling.  Neurological: Negative.   Hematological: Negative.    Depression screen [facility] 2/9 04/16/2019 04/16/2019 09/07/2017  Decreased Interest 0 0 0  Down, Depressed, Hopeless 0 0 0  PHQ - 2 Score 0 0 0  Altered sleeping 0 - 1  Tired, decreased energy 0 - 1  Change in appetite 0 - 0  Feeling bad or failure about yourself  0 - 0  Trouble concentrating 0 - 0  Moving slowly or fidgety/restless 0 - 0  Suicidal thoughts 0 - 0  PHQ-9 Score 0 - 2  Difficult doing work/chores - - -      Objective:    Physical Exam  Constitutional: She is oriented to person, place, and time. She  appears well-developed and well-nourished. No distress.  HENT:  Head: Normocephalic and atraumatic.  Right Ear: External ear normal.  Left Ear: External ear normal.  Mouth/Throat: Oropharynx is clear and moist.  Eyes: Conjunctivae are normal. Right eye exhibits no discharge. Left eye exhibits no discharge. No scleral icterus.  Neck: No JVD present. No tracheal deviation present. No thyromegaly present.  Cardiovascular: Normal rate, regular rhythm and normal heart sounds.  Pulmonary/Chest: Effort normal and breath sounds normal. No stridor.  Musculoskeletal:     Cervical back: Neck supple.  Lymphadenopathy:    She has no cervical adenopathy.  Neurological: She is alert and oriented to person, place, and time.  Skin: Skin is warm and dry. She is not diaphoretic.  Psychiatric: She has a normal mood and affect. Her behavior is normal.    BP 136/78   Pulse 84   Temp (!) 97 F (36.1 C) (Tympanic)   Ht 5\' 2"  (1.575 m)   Wt 159 lb 6.4 oz (72.3 kg)   SpO2 98%   BMI 29.15 kg/m  Wt Readings from Last 3 Encounters:  04/16/19 159 lb 6.4 oz (72.3 kg)  09/07/17 161 lb 2 oz (73.1 kg)  03/23/17 160 lb 9.6 oz (72.8 kg)     Health Maintenance Due  Topic Date Due  . TETANUS/TDAP  Never done  . PAP SMEAR-Modifier  Never done    There are no preventive care reminders to display for this patient.  Lab Results  Component Value Date   TSH 2.07 12/13/2016   Lab Results  Component Value Date   WBC 3.9 (L) 12/13/2016   HGB 14.3 12/13/2016   HCT 43.8 12/13/2016   MCV 92.3 12/13/2016   PLT 232.0 12/13/2016   Lab Results  Component Value Date   NA 139 12/13/2016   K 4.3 12/13/2016   CO2 28 12/13/2016   GLUCOSE 95 12/13/2016   BUN 12 12/13/2016   CREATININE 0.65 12/13/2016   BILITOT 0.8 12/13/2016   ALKPHOS 113 12/13/2016   AST 21 12/13/2016   ALT 18 12/13/2016   PROT 7.4 12/13/2016   ALBUMIN 4.4 12/13/2016   CALCIUM 11.0 (H) 12/13/2016   GFR 118.75 12/13/2016   Lab Results    Component Value Date   CHOL 160 12/13/2016   Lab Results  Component Value Date   HDL 45.00 12/13/2016   Lab Results  Component Value Date   LDLCALC 95 12/13/2016   Lab Results  Component Value Date   TRIG 99.0 12/13/2016   Lab Results  Component Value Date   CHOLHDL 4 12/13/2016   No results found for: HGBA1C    Assessment & Plan:   Problem List Items Addressed This Visit      Other   Healthcare maintenance - Primary   Relevant Orders   CBC   Comprehensive metabolic panel   Lipid panel   Urinalysis, Routine w reflex microscopic   Ambulatory referral to Gynecology   Dysthymia   Relevant Medications   PARoxetine (PAXIL) 10 MG tablet      Meds ordered this encounter  Medications  . PARoxetine (PAXIL) 10 MG tablet    Sig: Take 1 tablet (10 mg total) by mouth daily.    Dispense:  90 tablet    Refill:  1    Follow-up: Return in about 6 months (around 10/16/2019).   Information given on health maintenance and disease prevention.  Restart Paxil at low dose. Libby Maw, MD

## 2019-04-18 NOTE — Addendum Note (Signed)
Addended by: Jon Billings on: 04/18/2019 04:58 AM   Modules accepted: Orders

## 2019-04-22 ENCOUNTER — Other Ambulatory Visit: Payer: Self-pay

## 2019-04-23 ENCOUNTER — Ambulatory Visit: Payer: 59 | Attending: Internal Medicine

## 2019-04-23 ENCOUNTER — Other Ambulatory Visit (INDEPENDENT_AMBULATORY_CARE_PROVIDER_SITE_OTHER): Payer: 59

## 2019-04-23 DIAGNOSIS — Z23 Encounter for immunization: Secondary | ICD-10-CM

## 2019-04-23 DIAGNOSIS — E559 Vitamin D deficiency, unspecified: Secondary | ICD-10-CM | POA: Diagnosis not present

## 2019-04-23 LAB — VITAMIN D 25 HYDROXY (VIT D DEFICIENCY, FRACTURES): VITD: 23.42 ng/mL — ABNORMAL LOW (ref 30.00–100.00)

## 2019-04-23 NOTE — Progress Notes (Signed)
   Covid-19 Vaccination Clinic  Name:  Vicki George    MRN: [id number] DOB: 1955/04/07  04/23/2019  [name] was observed post Covid-19 immunization for 15 minutes without incident. She was provided with Vaccine Information Sheet and instruction to access the V-Safe system.   [name] was instructed to call 911 with any severe reactions post vaccine: Marland Kitchen Difficulty breathing  . Swelling of face and throat  . A fast heartbeat  . A bad rash all over body  . Dizziness and weakness   Immunizations Administered    Name Date Dose VIS Date Route   Pfizer COVID-19 Vaccine 04/23/2019  9:43 AM 0.3 mL 02/26/2018 Intramuscular   Manufacturer: [place]   Lot: [id number]   [place]: [id number]

## 2019-04-24 LAB — PARATHYROID HORMONE, INTACT (NO CA): PTH: 87 pg/mL — ABNORMAL HIGH (ref 14–64)

## 2019-04-27 DIAGNOSIS — E213 Hyperparathyroidism, unspecified: Secondary | ICD-10-CM | POA: Insufficient documentation

## 2019-04-30 ENCOUNTER — Telehealth (INDEPENDENT_AMBULATORY_CARE_PROVIDER_SITE_OTHER): Payer: 59 | Admitting: Family Medicine

## 2019-04-30 ENCOUNTER — Encounter: Payer: Self-pay | Admitting: Family Medicine

## 2019-04-30 DIAGNOSIS — E559 Vitamin D deficiency, unspecified: Secondary | ICD-10-CM | POA: Diagnosis not present

## 2019-04-30 DIAGNOSIS — E7800 Pure hypercholesterolemia, unspecified: Secondary | ICD-10-CM | POA: Insufficient documentation

## 2019-04-30 DIAGNOSIS — E213 Hyperparathyroidism, unspecified: Secondary | ICD-10-CM

## 2019-04-30 NOTE — Progress Notes (Signed)
Established Patient Office Visit  Subjective:  Patient ID: Vicki George, female    DOB: Apr 02, 1955  Age: 64 y.o. MRN: [id number]  CC:  Chief Complaint  Patient presents with  . Follow-up    discuss labs, patient states that new medication (Paxil) makes her stomach feel weak.     HPI Vicki George presents for to discuss her elevated PTH with elevated calcium and low vitamin D.  Calcium levels have been mildly elevated over the last year or so.  Vitamin D levels have been low.  She has been prescribed vitamin D but has not been taking it.  She is not taking supplemental calcium or vitamin D.  She is not currently exercising.  She admits that her diet could improve with regards to cholesterol in the past.  Past Medical History:  Diagnosis Date  . Chicken pox   . Depression   . Urinary tract infection     No past surgical history on file.  No family history on file.  Social History   Socioeconomic History  . Marital status: Widowed    Spouse name: Not on file  . Number of children: Not on file  . Years of education: Not on file  . Highest education level: Not on file  Occupational History  . Not on file  Tobacco Use  . Smoking status: Never Smoker  . Smokeless tobacco: Never Used  Substance and Sexual Activity  . Alcohol use: No  . Drug use: No  . Sexual activity: Not on file  Other Topics Concern  . Not on file  Social History Narrative  . Not on file   Social Determinants of Health   Financial Resource Strain:   . Difficulty of Paying Living Expenses:   Food Insecurity:   . Worried About Charity fundraiser in the Last Year:   . Arboriculturist in the Last Year:   Transportation Needs:   . Film/video editor (Medical):   Marland Kitchen Lack of Transportation (Non-Medical):   Physical Activity:   . Days of Exercise per Week:   . Minutes of Exercise per Session:   Stress:   . Feeling of Stress :   Social Connections:   . Frequency of Communication with Friends and  Family:   . Frequency of Social Gatherings with Friends and Family:   . Attends Religious Services:   . Active Member of Clubs or Organizations:   . Attends Archivist Meetings:   Marland Kitchen Marital Status:   Intimate Partner Violence:   . Fear of Current or Ex-Partner:   . Emotionally Abused:   Marland Kitchen Physically Abused:   . Sexually Abused:     Outpatient Medications Prior to Visit  Medication Sig Dispense Refill  . PARoxetine (PAXIL) 10 MG tablet Take 1 tablet (10 mg total) by mouth daily. 90 tablet 1  . fluticasone (FLONASE) 50 MCG/ACT nasal spray Place 2 sprays into both nostrils daily. (Patient not taking: Reported on 04/16/2019) 16 g 6  . Vitamin D, Ergocalciferol, (DRISDOL) 1.25 MG (50000 UT) CAPS capsule TAKE 1 CAPSULE BY MOUTH ONCE A WEEK (EVERY  7  DAYS) (Patient not taking: Reported on 04/16/2019) 12 capsule 0   No facility-administered medications prior to visit.    Allergies  Allergen Reactions  . Aspirin Other (See Comments)    ROS Review of Systems  Constitutional: Negative.   HENT: Negative.   Respiratory: Negative.   Cardiovascular: Negative.   Gastrointestinal: Negative.   Musculoskeletal: Negative  for arthralgias, gait problem and myalgias.  Skin: Negative.   Neurological: Negative for weakness and numbness.  Psychiatric/Behavioral: Negative.       Objective:    Physical Exam  Constitutional: She is oriented to person, place, and time. No distress.  Pulmonary/Chest: Effort normal.  Neurological: She is alert and oriented to person, place, and time.  Psychiatric: She has a normal mood and affect. Her behavior is normal.    Ht 5\' 2"  (1.575 m)   Wt 160 lb (72.6 kg)   BMI 29.26 kg/m  Wt Readings from Last 3 Encounters:  04/30/19 160 lb (72.6 kg)  04/16/19 159 lb 6.4 oz (72.3 kg)  09/07/17 161 lb 2 oz (73.1 kg)     Health Maintenance Due  Topic Date Due  . TETANUS/TDAP  Never done  . PAP SMEAR-Modifier  Never done    There are no preventive  care reminders to display for this patient.  Lab Results  Component Value Date   TSH 2.07 12/13/2016   Lab Results  Component Value Date   WBC 4.5 04/16/2019   HGB 14.3 04/16/2019   HCT 42.3 04/16/2019   MCV 89.4 04/16/2019   PLT 208.0 04/16/2019   Lab Results  Component Value Date   NA 137 04/16/2019   K 4.6 04/16/2019   CO2 24 04/16/2019   GLUCOSE 105 (H) 04/16/2019   BUN 18 04/16/2019   CREATININE 0.73 04/16/2019   BILITOT 1.3 (H) 04/16/2019   ALKPHOS 129 (H) 04/16/2019   AST 27 04/16/2019   ALT 21 04/16/2019   PROT 7.3 04/16/2019   ALBUMIN 4.3 04/16/2019   CALCIUM 11.2 (H) 04/16/2019   GFR 96.99 04/16/2019   Lab Results  Component Value Date   CHOL 178 04/16/2019   Lab Results  Component Value Date   HDL 39.00 (L) 04/16/2019   Lab Results  Component Value Date   LDLCALC 123 (H) 04/16/2019   Lab Results  Component Value Date   TRIG 83.0 04/16/2019   Lab Results  Component Value Date   CHOLHDL 5 04/16/2019   No results found for: HGBA1C    The 10-year ASCVD risk score Mikey Bussing DC Jr., et al., 2013) is: 8.4%   Values used to calculate the score:     Age: 68 years     Sex: Female     Is Non-Hispanic African American: Yes     Diabetic: No     Tobacco smoker: No     Systolic Blood Pressure: [id number] mmHg     Is BP treated: No     HDL Cholesterol: 39 mg/dL     Total Cholesterol: 178 mg/dL Assessment & Plan:   Problem List Items Addressed This Visit      Endocrine   Hyperparathyroidism, unspecified ([place])   Relevant Orders   DG Bone Density   Ambulatory referral to Endocrinology     Other   Vitamin D deficiency   Relevant Orders   DG Bone Density   Ambulatory referral to Endocrinology   Hypercalcemia - Primary   Relevant Orders   DG Bone Density   Ambulatory referral to Endocrinology   Elevated LDL cholesterol level      No orders of the defined types were placed in this encounter.   Follow-up: No follow-ups on file.   Patient will lower  the fat and cholesterol in her diet.  We discussed that her 10-year risk score is slightly elevated.  She will also start exercising for 30 minutes at least  5 days a week.  Suggesting walking would be an excellent way to start.  She agrees to go for follow-up with endocrinology for her elevated PTH and calcium.  73-month follow-up as already scheduled with me.   Libby Maw, MD   Virtual Visit via Video Note  I connected with Vicki George on 04/30/19 at 10:00 AM EDT by a video enabled telemedicine application and verified that I am speaking with the correct person using two identifiers.  Location: Patient: home alone Provider:    I discussed the limitations of evaluation and management by telemedicine and the availability of in person appointments. The patient expressed understanding and agreed to proceed.  History of Present Illness:    Observations/Objective:   Assessment and Plan:   Follow Up Instructions:    I discussed the assessment and treatment plan with the patient. The patient was provided an opportunity to ask questions and all were answered. The patient agreed with the plan and demonstrated an understanding of the instructions.   The patient was advised to call back or seek an in-person evaluation if the symptoms worsen or if the condition fails to improve as anticipated.  I provided 25 minutes of non-face-to-face time during this encounter.   Libby Maw, MD  Interactive video and audio telecommunications were attempted between myself and the patient. However they failed due to the patient having technical difficulties or not having access to video capability. We continued and completed with audio only.

## 2019-05-01 ENCOUNTER — Telehealth: Payer: Self-pay | Admitting: Family Medicine

## 2019-05-01 NOTE — Telephone Encounter (Signed)
Patient is calling and requested a refill for Vitamin D sent to [facility] on [address]. Also patient has additional medication questions Pls advise. CB [phone]

## 2019-05-01 NOTE — Telephone Encounter (Signed)
Spoke with patient who states that she would like a refill on Vitamin D and would also like to start on Calcium try these for a while before she see a specialist. Per Dr. Ethelene Hal he would like for patient to follow through with specialist prior to taken requested medications so they can see where she stands without medications. Patient verbally understands this and will continue with appointment for specialist.

## 2019-05-08 ENCOUNTER — Other Ambulatory Visit: Payer: Self-pay

## 2019-05-12 ENCOUNTER — Encounter: Payer: Self-pay | Admitting: Endocrinology

## 2019-05-12 ENCOUNTER — Ambulatory Visit (INDEPENDENT_AMBULATORY_CARE_PROVIDER_SITE_OTHER): Payer: 59 | Admitting: Endocrinology

## 2019-05-12 ENCOUNTER — Telehealth: Payer: Self-pay | Admitting: Endocrinology

## 2019-05-12 ENCOUNTER — Other Ambulatory Visit: Payer: Self-pay

## 2019-05-12 VITALS — BP 130/82 | HR 80 | Ht 62.0 in | Wt 154.0 lb

## 2019-05-12 DIAGNOSIS — E213 Hyperparathyroidism, unspecified: Secondary | ICD-10-CM | POA: Diagnosis not present

## 2019-05-12 NOTE — Telephone Encounter (Signed)
Patient requests to be called at ph# [phone] re: Patient saw Dr. Loanne Drilling this morning and was told to take 4000 units of Vitamin D. Patient is unable to find Vitamin D 4000 units. Patient was able to find Vitamin D in 2000 and 5000 units. Patient would like know if she can take 2 Vitamin D 2000 units instead of 1 Vitamin D 4000 units. Patient requests to be called to be advised about the above.

## 2019-05-12 NOTE — Progress Notes (Signed)
Subjective:    Patient ID: Vicki George, female    DOB: [birth year], 64 y.o.   MRN: [id number]  HPI Pt is referred by Dr Ethelene Hal, for hyperprathyroidism.  Pt was noted to have hypercalcemia in 2019. she has never had osteoporosis, urolithiasis, thyroid probs, sarcoidosis, cancer, PUD, pancreatitis, or bony fracture.  she does not take vitamin-D or A supplements.  Pt denies taking antacids, Li++, or HCTZ.   Past Medical History:  Diagnosis Date  . Chicken pox   . Depression   . Urinary tract infection     No past surgical history on file.  Social History   Socioeconomic History  . Marital status: Widowed    Spouse name: Not on file  . Number of children: Not on file  . Years of education: Not on file  . Highest education level: Not on file  Occupational History  . Not on file  Tobacco Use  . Smoking status: Never Smoker  . Smokeless tobacco: Never Used  Substance and Sexual Activity  . Alcohol use: No  . Drug use: No  . Sexual activity: Not on file  Other Topics Concern  . Not on file  Social History Narrative  . Not on file   Social Determinants of Health   Financial Resource Strain:   . Difficulty of Paying Living Expenses:   Food Insecurity:   . Worried About Charity fundraiser in the Last Year:   . Arboriculturist in the Last Year:   Transportation Needs:   . Film/video editor (Medical):   Marland Kitchen Lack of Transportation (Non-Medical):   Physical Activity:   . Days of Exercise per Week:   . Minutes of Exercise per Session:   Stress:   . Feeling of Stress :   Social Connections:   . Frequency of Communication with Friends and Family:   . Frequency of Social Gatherings with Friends and Family:   . Attends Religious Services:   . Active Member of Clubs or Organizations:   . Attends Archivist Meetings:   Marland Kitchen Marital Status:   Intimate Partner Violence:   . Fear of Current or Ex-Partner:   . Emotionally Abused:   Marland Kitchen Physically Abused:   . Sexually  Abused:     Current Outpatient Medications on File Prior to Visit  Medication Sig Dispense Refill  . PARoxetine (PAXIL) 10 MG tablet Take 1 tablet (10 mg total) by mouth daily. 90 tablet 1  . Vitamin D, Ergocalciferol, (DRISDOL) 1.25 MG (50000 UT) CAPS capsule TAKE 1 CAPSULE BY MOUTH ONCE A WEEK (EVERY  7  DAYS) (Patient not taking: Reported on 04/16/2019) 12 capsule 0   No current facility-administered medications on file prior to visit.    Allergies  Allergen Reactions  . Aspirin Other (See Comments)    Family History  Problem Relation Age of Onset  . Hypercalcemia Neg Hx     BP 130/82   Pulse 80   Ht 5\' 2"  (1.575 m)   Wt 154 lb (69.9 kg)   SpO2 97%   BMI 28.17 kg/m    Review of Systems Denies weight loss, visual loss, cough, edema, diarrhea, sore throat, polyuria, hematuria, rash, depression, numbness, and back pain.       Objective:   Physical Exam VS: see vs page GEN: no distress HEAD: head: no deformity eyes: no periorbital swelling, no proptosis external nose and ears are normal NECK: supple, thyroid is not enlarged CHEST WALL: no deformity.  No kyphosis LUNGS: clear to auscultation CV: reg rate and rhythm, no murmur.  MUSCULOSKELETAL: muscle bulk and strength are grossly normal.  no obvious joint swelling.  gait is normal and steady EXTEMITIES: no deformity.  no edema PULSES: no carotid bruit NEURO:  cn 2-12 grossly intact.   readily moves all 4's.  sensation is intact to touch on all 4's SKIN:  Normal texture and temperature.  No rash or suspicious lesion is visible.   NODES:  None palpable at the neck PSYCH: alert, well-oriented.  Does not appear anxious nor depressed.   Lab Results  Component Value Date   ALT 21 04/16/2019   AST 27 04/16/2019   ALKPHOS 129 (H) 04/16/2019   BILITOT 1.3 (H) 04/16/2019     Lab Results  Component Value Date   PTH 87 (H) 04/23/2019   CALCIUM 11.2 (H) 04/16/2019   I have reviewed outside records, and  summarized: Pt was noted to have elevated Ca++, and referred here.  Dysthymia and wellness were also addressed     Assessment & Plan:  Hyperparathyroidism, new to me, prob primary Vit-D def: we need to fix this before we can be certain about the above  Patient Instructions  Please take non-prescription vitamin-D, 4000 units per day. Please come back for a follow-up appointment in 2 months.  Please do blood tests approx 1 week prior.  You do not need to be fasting, but please call a day ahead, so the lab can expect you.

## 2019-05-12 NOTE — Patient Instructions (Addendum)
Please take non-prescription vitamin-D, 4000 units per day. Please come back for a follow-up appointment in 2 months.  Please do blood tests approx 1 week prior.  You do not need to be fasting, but please call a day ahead, so the lab can expect you.

## 2019-05-12 NOTE — Telephone Encounter (Signed)
Called pt and advised it is appropriate to take TWO (2) tablets of 2000 Vit D to be equivalent to the recommended dose of 4000 Vit D. Verbalized acceptance and understanding.

## 2019-06-16 ENCOUNTER — Ambulatory Visit (HOSPITAL_BASED_OUTPATIENT_CLINIC_OR_DEPARTMENT_OTHER): Payer: 59 | Admitting: Obstetrics & Gynecology

## 2019-06-16 ENCOUNTER — Other Ambulatory Visit: Payer: Self-pay

## 2019-06-16 ENCOUNTER — Other Ambulatory Visit (HOSPITAL_COMMUNITY)
Admission: RE | Admit: 2019-06-16 | Discharge: 2019-06-16 | Disposition: A | Discharge status: Home / Self Care | Payer: 59 | Source: Ambulatory Visit | Attending: Obstetrics & Gynecology | Admitting: Obstetrics & Gynecology

## 2019-06-16 ENCOUNTER — Encounter: Payer: Self-pay | Admitting: Obstetrics & Gynecology

## 2019-06-16 ENCOUNTER — Ambulatory Visit (HOSPITAL_BASED_OUTPATIENT_CLINIC_OR_DEPARTMENT_OTHER)
Admission: RE | Admit: 2019-06-16 | Discharge: 2019-06-16 | Disposition: A | Discharge status: Home / Self Care | Payer: 59 | Source: Ambulatory Visit | Attending: Family Medicine | Admitting: Family Medicine

## 2019-06-16 VITALS — BP 126/73 | HR 76 | Ht 62.5 in | Wt 153.0 lb

## 2019-06-16 DIAGNOSIS — E559 Vitamin D deficiency, unspecified: Secondary | ICD-10-CM | POA: Diagnosis present

## 2019-06-16 DIAGNOSIS — Z1239 Encounter for other screening for malignant neoplasm of breast: Secondary | ICD-10-CM

## 2019-06-16 DIAGNOSIS — Z780 Asymptomatic menopausal state: Secondary | ICD-10-CM

## 2019-06-16 DIAGNOSIS — E213 Hyperparathyroidism, unspecified: Secondary | ICD-10-CM | POA: Diagnosis present

## 2019-06-16 DIAGNOSIS — Z01419 Encounter for gynecological examination (general) (routine) without abnormal findings: Secondary | ICD-10-CM

## 2019-06-16 NOTE — Progress Notes (Signed)
Subjective:     Vicki George is a 64 y.o. female here for a routine exam.  Current complaints: none. LMP at age 21 years. No bleeding since that time. She denies urinary sx   Gynecologic History No LMP recorded. (Menstrual status: Other). Contraception: post menopausal status Last Pap: 5-6 years prev. Results were: normal Last mammogram: 10/26/2017. Results were: normal  Obstetric History OB History  Gravida Para Term Preterm AB Living  1 1 1     1   SAB TAB Ectopic Multiple Live Births          1    # Outcome Date GA Lbr Len/2nd Weight Sex Delivery Anes PTL Lv  1 Term      Vag-Spont        The following portions of the patient's history were reviewed and updated as appropriate: allergies, current medications, past family history, past medical history, past social history, past surgical history and problem list.  Review of Systems Pertinent items are noted in HPI.    Objective:  BP 126/73   Pulse 76   Ht 5' 2.5" (1.588 m)   Wt 153 lb (69.4 kg)   BMI 27.54 kg/m  General Appearance:    Alert, cooperative, no distress, appears stated age  Head:    Normocephalic, without obvious abnormality, atraumatic  Eyes:    conjunctiva/corneas clear, EOM's intact, both eyes  Ears:    Normal external ear canals, both ears  Nose:   Nares normal, septum midline, mucosa normal, no drainage    or sinus tenderness  Throat:   Lips, mucosa, and tongue normal; teeth and gums normal  Neck:   Supple, symmetrical, trachea midline, no adenopathy;    thyroid:  no enlargement/tenderness/nodules  Back:     Symmetric, no curvature, ROM normal, no CVA tenderness  Lungs:     respirations unlabored  Chest Wall:    No tenderness or deformity   Heart:    Regular rate and rhythm  Breast Exam:    No tenderness, masses, or nipple abnormality  Abdomen:     Soft, non-tender, bowel sounds active all four quadrants,    no masses, no organomegaly  Genitalia:    Normal female without lesion, discharge or tenderness    Well healed transverse lowe abd incision  Extremities:   Extremities normal, atraumatic, no cyanosis or edema  Pulses:   2+ and symmetric all extremities  Skin:   Skin color, texture, turgor normal, no rashes or lesions      Assessment:    Healthy female exam.   Menopausal state   Plan:    f/u PAP and hrHPV F/u in 1 year or sooner prn  Latora Quarry L. Harraway-Smith, M.D., Cherlynn June

## 2019-06-16 NOTE — Addendum Note (Signed)
Addended by: Phill Myron on: 06/16/2019 10:05 AM   Modules accepted: Orders

## 2019-06-18 LAB — CYTOLOGY - PAP
Comment: NEGATIVE
Diagnosis: NEGATIVE
High risk HPV: NEGATIVE

## 2019-06-30 ENCOUNTER — Ambulatory Visit (HOSPITAL_BASED_OUTPATIENT_CLINIC_OR_DEPARTMENT_OTHER)
Admission: RE | Admit: 2019-06-30 | Discharge: 2019-06-30 | Disposition: A | Discharge status: Home / Self Care | Payer: 59 | Source: Ambulatory Visit | Attending: Obstetrics & Gynecology | Admitting: Obstetrics & Gynecology

## 2019-06-30 ENCOUNTER — Telehealth: Payer: Self-pay | Admitting: Family Medicine

## 2019-06-30 ENCOUNTER — Other Ambulatory Visit: Payer: Self-pay

## 2019-06-30 DIAGNOSIS — Z1239 Encounter for other screening for malignant neoplasm of breast: Secondary | ICD-10-CM

## 2019-06-30 DIAGNOSIS — Z1231 Encounter for screening mammogram for malignant neoplasm of breast: Secondary | ICD-10-CM | POA: Insufficient documentation

## 2019-06-30 DIAGNOSIS — H539 Unspecified visual disturbance: Secondary | ICD-10-CM

## 2019-06-30 NOTE — Telephone Encounter (Signed)
Patient would like referral to an Ophthalmologist for cataract removal.

## 2019-06-30 NOTE — Telephone Encounter (Signed)
Okay to refer patient to ophthalmologist for cataract removal. Please advise.

## 2019-07-09 ENCOUNTER — Other Ambulatory Visit (INDEPENDENT_AMBULATORY_CARE_PROVIDER_SITE_OTHER): Payer: 59

## 2019-07-09 ENCOUNTER — Other Ambulatory Visit: Payer: Self-pay

## 2019-07-09 DIAGNOSIS — E213 Hyperparathyroidism, unspecified: Secondary | ICD-10-CM

## 2019-07-09 LAB — HEPATIC FUNCTION PANEL
ALT: 17 U/L (ref 0–35)
AST: 20 U/L (ref 0–37)
Albumin: 4.1 g/dL (ref 3.5–5.2)
Alkaline Phosphatase: 120 U/L — ABNORMAL HIGH (ref 39–117)
Bilirubin, Direct: 0.2 mg/dL (ref 0.0–0.3)
Total Bilirubin: 1 mg/dL (ref 0.2–1.2)
Total Protein: 6.7 g/dL (ref 6.0–8.3)

## 2019-07-09 LAB — VITAMIN D 25 HYDROXY (VIT D DEFICIENCY, FRACTURES): VITD: 40.92 ng/mL (ref 30.00–100.00)

## 2019-07-10 LAB — PTH, INTACT AND CALCIUM
Calcium: 11.2 mg/dL — ABNORMAL HIGH (ref 8.6–10.4)
PTH: 50 pg/mL (ref 14–64)

## 2019-07-11 NOTE — Telephone Encounter (Signed)
done

## 2019-07-15 ENCOUNTER — Other Ambulatory Visit: Payer: Self-pay

## 2019-07-15 ENCOUNTER — Ambulatory Visit (INDEPENDENT_AMBULATORY_CARE_PROVIDER_SITE_OTHER): Payer: 59 | Admitting: Endocrinology

## 2019-07-15 VITALS — BP 122/80 | HR 80 | Ht 62.52 in | Wt 156.0 lb

## 2019-07-15 DIAGNOSIS — E213 Hyperparathyroidism, unspecified: Secondary | ICD-10-CM | POA: Diagnosis not present

## 2019-07-15 NOTE — Patient Instructions (Addendum)
Please do these and some other blood tests in approx 1 month.   Please come back for a follow-up appointment in 3 months.

## 2019-07-15 NOTE — Progress Notes (Signed)
Subjective:    Patient ID: Vicki George, female    DOB: 31-May-1955, 64 y.o.   MRN: [id number]  HPI Pt returns for f/u of hyperparthyroidism (hypercalcemia was dx'ed in 2019; PTH was high, so plan is to normalize Vit-D, then recheck labs).  She takes vit-D as rx'ed.  pt states she feels well in general.   Past Medical History:  Diagnosis Date  . Chicken pox   . Depression   . Urinary tract infection     No past surgical history on file.  Social History   Socioeconomic History  . Marital status: Widowed    Spouse name: Not on file  . Number of children: Not on file  . Years of education: Not on file  . Highest education level: Not on file  Occupational History  . Not on file  Tobacco Use  . Smoking status: Never Smoker  . Smokeless tobacco: Never Used  Vaping Use  . Vaping Use: Never used  Substance and Sexual Activity  . Alcohol use: No  . Drug use: No  . Sexual activity: Not Currently  Other Topics Concern  . Not on file  Social History Narrative  . Not on file   Social Determinants of Health   Financial Resource Strain:   . Difficulty of Paying Living Expenses:   Food Insecurity:   . Worried About Charity fundraiser in the Last Year:   . Arboriculturist in the Last Year:   Transportation Needs:   . Film/video editor (Medical):   Marland Kitchen Lack of Transportation (Non-Medical):   Physical Activity:   . Days of Exercise per Week:   . Minutes of Exercise per Session:   Stress:   . Feeling of Stress :   Social Connections:   . Frequency of Communication with Friends and Family:   . Frequency of Social Gatherings with Friends and Family:   . Attends Religious Services:   . Active Member of Clubs or Organizations:   . Attends Archivist Meetings:   Marland Kitchen Marital Status:   Intimate Partner Violence:   . Fear of Current or Ex-Partner:   . Emotionally Abused:   Marland Kitchen Physically Abused:   . Sexually Abused:     Current Outpatient Medications on File Prior to  Visit  Medication Sig Dispense Refill  . PARoxetine (PAXIL) 10 MG tablet Take 1 tablet (10 mg total) by mouth daily. 90 tablet 1  . Vitamin D, Ergocalciferol, (DRISDOL) 1.25 MG (50000 UT) CAPS capsule TAKE 1 CAPSULE BY MOUTH ONCE A WEEK (EVERY  7  DAYS) 12 capsule 0   No current facility-administered medications on file prior to visit.    Allergies  Allergen Reactions  . Aspirin Other (See Comments)    Family History  Problem Relation Age of Onset  . Hypercalcemia Neg Hx     BP 122/80 (BP Location: Left Arm, Patient Position: Sitting)   Pulse 80   Ht 5' 2.52" (1.588 m)   Wt 156 lb (70.8 kg)   SpO2 97%   BMI 28.06 kg/m   Review of Systems Denies muscle cramps and numbness.      Objective:   Physical Exam VITAL SIGNS:  See vs page.  GENERAL: no distress.   Ext: no leg edema.       Assessment & Plan:  Hypercalcemia: due for recheck soon Hyperparathyroidism: I advised pt to continue the same Vit-D  Patient Instructions  Please do these and some other blood tests  in approx 1 month.   Please come back for a follow-up appointment in 3 months.

## 2019-08-14 ENCOUNTER — Other Ambulatory Visit: Payer: Self-pay

## 2019-08-14 ENCOUNTER — Other Ambulatory Visit (INDEPENDENT_AMBULATORY_CARE_PROVIDER_SITE_OTHER): Payer: 59

## 2019-08-14 LAB — VITAMIN D 25 HYDROXY (VIT D DEFICIENCY, FRACTURES): VITD: 38.91 ng/mL (ref 30.00–100.00)

## 2019-08-19 LAB — PTH-RELATED PEPTIDE: PTH-Related Protein (PTH-RP): 9 pg/mL — ABNORMAL LOW (ref 14–27)

## 2019-08-19 LAB — VITAMIN A: Vitamin A (Retinoic Acid): 42 ug/dL (ref 38–98)

## 2019-08-19 LAB — VITAMIN D 1,25 DIHYDROXY
Vitamin D 1, 25 (OH)2 Total: 69 pg/mL (ref 18–72)
Vitamin D2 1, 25 (OH)2: <8 pg/mL
Vitamin D3 1, 25 (OH)2: 69 pg/mL

## 2019-08-19 LAB — PTH, INTACT AND CALCIUM
Calcium: 11.2 mg/dL — ABNORMAL HIGH (ref 8.6–10.4)
PTH: 133 pg/mL — ABNORMAL HIGH (ref 14–64)

## 2019-08-20 ENCOUNTER — Other Ambulatory Visit: Payer: Self-pay | Admitting: Endocrinology

## 2019-08-20 ENCOUNTER — Telehealth: Payer: Self-pay

## 2019-08-20 DIAGNOSIS — E213 Hyperparathyroidism, unspecified: Secondary | ICD-10-CM | POA: Insufficient documentation

## 2019-08-20 DIAGNOSIS — E210 Primary hyperparathyroidism: Secondary | ICD-10-CM

## 2019-08-20 NOTE — Telephone Encounter (Signed)
-----   Message from Renato Shin, MD sent at 08/19/2019  7:42 PM EDT ----- please contact patient: The vitamin-D is up to normal--good.  The calcium and parathyroid are still high, so the overactive parathyroid is the cause of the high calcium.  The only treatment is surgery.  Due to your osteoporosis, you should go to a surgery specialist to discuss this.  Ok to refer?

## 2019-08-20 NOTE — Telephone Encounter (Signed)
RESULTS  Results were reviewed by Dr. Loanne Drilling. Called pt to inform about results as well as recommendation to refer to specialist. Agreed to referral. Verbalized acceptance and understanding. At pt request, a letter has been mailed to pt home address. For future reference, letter can be found in [facility].

## 2019-08-20 NOTE — Telephone Encounter (Signed)
Below is being sent as an FYI for scheduling purposes:  Ambulatory referral to General Surgery (Order [id number]) Outpatient Referral Date: 08/20/2019 Department: [facility] Ordering/Authorizing: Renato Shin, MD    Renato Shin, MD NPI: [id number]     Patient Information  Patient Name  Vicki George, Vicki George Legal Sex  Female DOB  Jan 09, 1955 SSN  [id number]  Order Information  Order Date/Time Release Date/Time Start Date/Time End Date/Time  08/20/19 04:41 PM None 08/20/2019 None  Order History Outpatient Date/Time Action Taken User Additional Information  08/20/19 1641 Sign Renato Shin, MD   Order Questions  Question Answer  reason for referral Other       Reference Links       Associated Diagnoses  Hyperparathyroidism, primary [facility])     Ambulatory referral to General Surgery: Patient Communication  Not Released Not seen  Collection Information   Encounter  View Encounter         Tracking Links Cosign Tracking Order Transmittal Tracking

## 2019-08-20 NOTE — Telephone Encounter (Signed)
done

## 2019-08-21 ENCOUNTER — Telehealth: Payer: Self-pay | Admitting: Family Medicine

## 2019-08-21 NOTE — Telephone Encounter (Signed)
Patient received test results from endocrinologist. She would like Dr. Ethelene Hal to review them with her.

## 2019-08-22 NOTE — Telephone Encounter (Signed)
Referral was faxed today and I have received confirmation-FYI

## 2019-08-22 NOTE — Telephone Encounter (Signed)
Spoke with patient who states that she would like for Dr. Ethelene Hal to go over labs from endocrinology with her. Per patient she was told that she needed to have surgery she's not sure if/why this is actually needed and would like your opinion on it.

## 2019-08-24 NOTE — Telephone Encounter (Signed)
I reviewed the labs and I think that the referral to the surgical specialist is warranted. Just so she knows, they may only need to remove part of the actual gland.

## 2019-08-26 ENCOUNTER — Telehealth: Payer: Self-pay | Admitting: Endocrinology

## 2019-08-26 NOTE — Telephone Encounter (Signed)
Patient aware of message below.

## 2019-08-26 NOTE — Telephone Encounter (Signed)
Patient had a couple questions about her lab results. Requests a call back to discuss. Ph# [phone]

## 2019-08-26 NOTE — Telephone Encounter (Signed)
Spoke with patient who verbally understood her labs were viewed by Dr. Ethelene Hal and he felt as if surgery was necessary. Patient wanting to know if Dr. Ethelene Hal felt like she should take Calcium and something for thyroid while she is awaiting on details about possible surgery. Patient would also like to know if Dr. Ethelene Hal knew about how long she could possibly live with overactive parathyroid. Please advise.

## 2019-08-26 NOTE — Telephone Encounter (Signed)
Better question for Dr. Loanne Drilling.

## 2019-08-26 NOTE — Telephone Encounter (Signed)
Called patient to inform of Dr. Bebe Shaggy message no answer LMTCB

## 2019-08-27 NOTE — Telephone Encounter (Signed)
Returned pt call. States she was following up to see if we had heard anything from [facility] re: a date/time for her appt. Advised her records have been faxed and that [facility] will call her to schedule the appt. At this time we have not received a response re: her appt. Contact info provided for [facility] and advised she call to follow up about the status of her referral. Verbalized acceptance and understanding.

## 2019-09-17 ENCOUNTER — Other Ambulatory Visit (HOSPITAL_COMMUNITY): Payer: Self-pay | Admitting: General Surgery

## 2019-09-17 ENCOUNTER — Other Ambulatory Visit: Payer: Self-pay | Admitting: General Surgery

## 2019-09-17 ENCOUNTER — Ambulatory Visit: Payer: Self-pay | Admitting: General Surgery

## 2019-09-17 DIAGNOSIS — E210 Primary hyperparathyroidism: Secondary | ICD-10-CM

## 2019-09-17 NOTE — H&P (Signed)
History of Present Illness Ralene Ok MD; 09/17/2019 2:49 PM) The patient is a 64 year old female who presents with a complaint of primary hyperparathyroidism. Patient is a 64 year old female who comes in secondary to a history of hyper calcium, hyperparathyroidism. Patient was referred by Dr. Loanne Drilling.  Patient has had hypo-vitamin D, this was supplemented and her vitamin D is currently normal. At the time patient as she had elevated calcium as well as PTH levels. These continue to remain elevated. Her PTH level recently in August 2021 was 133. Her vitamin D was 38.9 which is normal. Her calcium level was 11.2. This appears to be fairly static and elevated.  Patient had no symptoms of hypercalcemia, has had no recent pathological fractures, or kidney stones. He's had no cramping, GI dysfunction.  She's had no imaging studies.   Past Surgical History (Chanel Teressa Senter, [place]; 09/17/2019 2:16 PM) No pertinent past surgical history   Diagnostic Studies History (Chanel Teressa Senter, [place]; 09/17/2019 2:16 PM) Colonoscopy  never Mammogram  1-3 years ago Pap Smear  1-5 years ago  Allergies (Chanel Teressa Senter, CMA; 09/17/2019 2:16 PM) Aspirin *ANALGESICS - NonNarcotic*  Allergies Reconciled   Medication History (Chanel Teressa Senter, CMA; 09/17/2019 2:17 PM) Vitamin D (Oral) Specific strength unknown - Active. Vitamin B Complex (Oral) Active. Vitamin C (Oral) Specific strength unknown - Active. Medications Reconciled  Pregnancy / Birth History Antonietta Jewel, [place]; 09/17/2019 2:16 PM) Age at menarche  63 years. Age of menopause  84-50 Gravida  1 Maternal age  >28  Other Problems Antonietta Jewel, [place]; 09/17/2019 2:16 PM) Anxiety Disorder     Review of Systems (Chanel Nolan CMA; 09/17/2019 2:16 PM) General Not Present- Appetite Loss, Chills, Fatigue, Fever, Night Sweats, Weight Gain and Weight Loss. Skin Not Present- Change in Wart/Mole, Dryness, Hives, Jaundice, New Lesions, Non-Healing Wounds,  Rash and Ulcer. HEENT Present- Seasonal Allergies, Visual Disturbances and Wears glasses/contact lenses. Not Present- Earache, Hearing Loss, Hoarseness, Nose Bleed, Oral Ulcers, Ringing in the Ears, Sinus Pain, Sore Throat and Yellow Eyes. Breast Not Present- Breast Mass, Breast Pain, Nipple Discharge and Skin Changes. Cardiovascular Present- Leg Cramps. Not Present- Chest Pain, Difficulty Breathing Lying Down, Palpitations, Rapid Heart Rate, Shortness of Breath and Swelling of Extremities. Female Genitourinary Not Present- Frequency, Nocturia, Painful Urination, Pelvic Pain and Urgency. Musculoskeletal Not Present- Back Pain, Joint Pain, Joint Stiffness, Muscle Pain, Muscle Weakness and Swelling of Extremities. Neurological Not Present- Decreased Memory, Fainting, Headaches, Numbness, Seizures, Tingling, Tremor, Trouble walking and Weakness. Psychiatric Present- Anxiety. Not Present- Bipolar, Change in Sleep Pattern, Depression, Fearful and Frequent crying. Endocrine Present- Hot flashes. Not Present- Cold Intolerance, Excessive Hunger, Hair Changes, Heat Intolerance and New Diabetes. Hematology Present- Gland problems. Not Present- Blood Thinners, Easy Bruising, Excessive bleeding, HIV and Persistent Infections.  Vitals (Chanel Nolan CMA; 09/17/2019 2:18 PM) 09/17/2019 2:17 PM Weight: 152.25 lb Height: 61in Body Surface Area: 1.68 m Body Mass Index: 28.77 kg/m  Temp.: 97.38F  Pulse: 101 (Regular)  BP: 130/76(Sitting, Left Arm, Standard)       Physical Exam Ralene Ok MD; 09/17/2019 2:49 PM) The physical exam findings are as follows: Note: Constitutional: No acute distress, conversant, appears stated age  Eyes: Anicteric sclerae, moist conjunctiva, no lid lag  Neck: No thyromegaly, trachea midline, no cervical lymphadenopathy  Lungs: Clear to auscultation biilaterally, normal respiratory effot  Cardiovascular: regular rate & rhythm, no murmurs, no peripheal edema,  pedal pulses 2+  GI: Soft, no masses or hepatosplenomegaly, non-tender to palpation  MSK: Normal gait, no clubbing  cyanosis, edema  Skin: No rashes, palpation reveals normal skin turgor  Psychiatric: Appropriate judgment and insight, oriented to person, place, and time    Assessment & Plan Ralene Ok MD; 09/17/2019 2:50 PM) PRIMARY HYPERPARATHYROIDISM (E21.0) Impression: 64 year old female with 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 We will obtain an ultrasound and sestamibi scan for localization. 2. We'll have her scheduled for minimally invasive parathyroidectomy after the adenomas was localized. 3. Did discuss with her the risks and benefits of the procedure to include but not limited to: Infection, bleeding, damage to any structures, in particular the recurrent laryngeal nerve, possible need for further tests and/or surgery, as well as need for IV calcium supplementation.

## 2019-09-29 ENCOUNTER — Other Ambulatory Visit: Payer: Self-pay

## 2019-09-29 ENCOUNTER — Encounter (HOSPITAL_COMMUNITY)
Admission: RE | Admit: 2019-09-29 | Discharge: 2019-09-29 | Disposition: A | Discharge status: Home / Self Care | Payer: 59 | Source: Ambulatory Visit | Attending: General Surgery | Admitting: General Surgery

## 2019-09-29 ENCOUNTER — Ambulatory Visit (HOSPITAL_COMMUNITY)
Admission: RE | Admit: 2019-09-29 | Discharge: 2019-09-29 | Disposition: A | Discharge status: Home / Self Care | Payer: 59 | Source: Ambulatory Visit | Attending: General Surgery | Admitting: General Surgery

## 2019-09-29 DIAGNOSIS — E210 Primary hyperparathyroidism: Secondary | ICD-10-CM

## 2019-09-29 MED ORDER — TECHNETIUM TC 99M SESTAMIBI GENERIC - CARDIOLITE
24.5000 | Freq: Once | INTRAVENOUS | Status: AC | PRN
  Administered 2019-09-29: 24.5 via INTRAVENOUS

## 2019-10-01 ENCOUNTER — Other Ambulatory Visit: Payer: Self-pay | Admitting: General Surgery

## 2019-10-01 DIAGNOSIS — E210 Primary hyperparathyroidism: Secondary | ICD-10-CM

## 2019-10-02 ENCOUNTER — Other Ambulatory Visit (HOSPITAL_COMMUNITY): Payer: Self-pay | Admitting: General Surgery

## 2019-10-02 DIAGNOSIS — E210 Primary hyperparathyroidism: Secondary | ICD-10-CM

## 2019-10-08 ENCOUNTER — Other Ambulatory Visit: Payer: Self-pay

## 2019-10-08 ENCOUNTER — Other Ambulatory Visit (HOSPITAL_COMMUNITY): Payer: 59

## 2019-10-08 ENCOUNTER — Ambulatory Visit (HOSPITAL_COMMUNITY)
Admission: RE | Admit: 2019-10-08 | Discharge: 2019-10-08 | Disposition: A | Discharge status: Home / Self Care | Payer: 59 | Source: Ambulatory Visit | Attending: General Surgery | Admitting: General Surgery

## 2019-10-08 ENCOUNTER — Encounter (HOSPITAL_COMMUNITY): Payer: Self-pay

## 2019-10-08 ENCOUNTER — Encounter (HOSPITAL_COMMUNITY): Payer: 59

## 2019-10-08 DIAGNOSIS — E210 Primary hyperparathyroidism: Secondary | ICD-10-CM | POA: Insufficient documentation

## 2019-10-08 LAB — POCT I-STAT CREATININE: Creatinine, Ser: 0.8 mg/dL (ref 0.44–1.00)

## 2019-10-08 MED ORDER — IOHEXOL 300 MG/ML  SOLN
75.0000 mL | Freq: Once | INTRAMUSCULAR | Status: AC | PRN
  Administered 2019-10-08: 75 mL via INTRAVENOUS

## 2019-10-09 ENCOUNTER — Ambulatory Visit: Payer: 59 | Admitting: Endocrinology

## 2019-10-15 ENCOUNTER — Other Ambulatory Visit: Payer: Self-pay

## 2019-10-16 ENCOUNTER — Ambulatory Visit (INDEPENDENT_AMBULATORY_CARE_PROVIDER_SITE_OTHER): Payer: 59 | Admitting: Family Medicine

## 2019-10-16 ENCOUNTER — Encounter: Payer: Self-pay | Admitting: Family Medicine

## 2019-10-16 VITALS — BP 128/70 | HR 75 | Temp 97.3°F | Ht 62.0 in | Wt 150.4 lb

## 2019-10-16 DIAGNOSIS — R7309 Other abnormal glucose: Secondary | ICD-10-CM

## 2019-10-16 DIAGNOSIS — E7800 Pure hypercholesterolemia, unspecified: Secondary | ICD-10-CM | POA: Diagnosis not present

## 2019-10-16 DIAGNOSIS — F341 Dysthymic disorder: Secondary | ICD-10-CM | POA: Diagnosis not present

## 2019-10-16 LAB — URINALYSIS, ROUTINE W REFLEX MICROSCOPIC
Bilirubin Urine: NEGATIVE
Hgb urine dipstick: NEGATIVE
Ketones, ur: NEGATIVE
Leukocytes,Ua: NEGATIVE
Nitrite: NEGATIVE
RBC / HPF: NONE SEEN (ref 0–?)
Specific Gravity, Urine: 1.02 (ref 1.000–1.030)
Total Protein, Urine: NEGATIVE
Urine Glucose: NEGATIVE
Urobilinogen, UA: 0.2 (ref 0.0–1.0)
pH: 8 (ref 5.0–8.0)

## 2019-10-16 LAB — BASIC METABOLIC PANEL
BUN: 12 mg/dL (ref 6–23)
CO2: 27 mEq/L (ref 19–32)
Calcium: 11.6 mg/dL — ABNORMAL HIGH (ref 8.4–10.5)
Chloride: 106 mEq/L (ref 96–112)
Creatinine, Ser: 0.71 mg/dL (ref 0.40–1.20)
GFR: 89.42 mL/min (ref >60.00)
Glucose, Bld: 94 mg/dL (ref 70–99)
Potassium: 4.8 mEq/L (ref 3.5–5.1)
Sodium: 138 mEq/L (ref 135–145)

## 2019-10-16 LAB — LIPID PANEL
Cholesterol: 159 mg/dL (ref 0–200)
HDL: 42.5 mg/dL (ref >39.00)
LDL Cholesterol: 95 mg/dL (ref 0–99)
NonHDL: 116.6
Total CHOL/HDL Ratio: 4
Triglycerides: 107 mg/dL (ref 0.0–149.0)
VLDL: 21.4 mg/dL (ref 0.0–40.0)

## 2019-10-16 NOTE — Progress Notes (Signed)
Established Patient Office Visit  Subjective:  Patient ID: Vicki George, female    DOB: Oct 22, 1955  Age: 64 y.o. MRN: [id number]  CC:  Chief Complaint  Patient presents with  . Follow-up    6 month follow up, no concerns.     HPI Vicki George presents for follow-up of her dysthymia, elevated LDL cholesterol and glucose.  She has greatly changed her diet and has been able to lose some weight.  Hopefully these levels will be back to normal.  She elected not to try the Paxil.  She no longer feels depressed or anxious.  She has been taking charge of her health issues and is feeling much better about herself.  She has a lot going on in other areas of her physical health.  She is scheduled for cataract extraction next week.  She will be scheduled for parathyroid surgery in November.  She is otherwise doing well.  Past Medical History:  Diagnosis Date  . Chicken pox   . Depression   . Urinary tract infection     History reviewed. No pertinent surgical history.  Family History  Problem Relation Age of Onset  . Hypercalcemia Neg Hx     Social History   Socioeconomic History  . Marital status: Widowed    Spouse name: Not on file  . Number of children: Not on file  . Years of education: Not on file  . Highest education level: Not on file  Occupational History  . Not on file  Tobacco Use  . Smoking status: Never Smoker  . Smokeless tobacco: Never Used  Vaping Use  . Vaping Use: Never used  Substance and Sexual Activity  . Alcohol use: No  . Drug use: No  . Sexual activity: Not Currently  Other Topics Concern  . Not on file  Social History Narrative  . Not on file   Social Determinants of Health   Financial Resource Strain:   . Difficulty of Paying Living Expenses: Not on file  Food Insecurity:   . Worried About Charity fundraiser in the Last Year: Not on file  . Ran Out of Food in the Last Year: Not on file  Transportation Needs:   . Lack of Transportation (Medical):  Not on file  . Lack of Transportation (Non-Medical): Not on file  Physical Activity:   . Days of Exercise per Week: Not on file  . Minutes of Exercise per Session: Not on file  Stress:   . Feeling of Stress : Not on file  Social Connections:   . Frequency of Communication with Friends and Family: Not on file  . Frequency of Social Gatherings with Friends and Family: Not on file  . Attends Religious Services: Not on file  . Active Member of Clubs or Organizations: Not on file  . Attends Archivist Meetings: Not on file  . Marital Status: Not on file  Intimate Partner Violence:   . Fear of Current or Ex-Partner: Not on file  . Emotionally Abused: Not on file  . Physically Abused: Not on file  . Sexually Abused: Not on file    Outpatient Medications Prior to Visit  Medication Sig Dispense Refill  . Ascorbic Acid (VITAMIN C) 100 MG tablet Take 100 mg by mouth daily.    Marland Kitchen b complex vitamins capsule Take 1 capsule by mouth daily.    . Vitamin D, Ergocalciferol, (DRISDOL) 1.25 MG (50000 UT) CAPS capsule TAKE 1 CAPSULE BY MOUTH ONCE A WEEK (  EVERY  7  DAYS) 12 capsule 0  . PARoxetine (PAXIL) 10 MG tablet Take 1 tablet (10 mg total) by mouth daily. (Patient not taking: Reported on 10/16/2019) 90 tablet 1   No facility-administered medications prior to visit.    Allergies  Allergen Reactions  . Aspirin Other (See Comments)    ROS Review of Systems  Constitutional: Negative.   Eyes: Positive for visual disturbance. Negative for photophobia.  Respiratory: Negative.   Cardiovascular: Negative.   Gastrointestinal: Negative.   Genitourinary: Negative.   Musculoskeletal: Negative for myalgias.  Neurological: Negative for seizures, speech difficulty, weakness and numbness.  Hematological: Does not bruise/bleed easily.  Psychiatric/Behavioral: Negative.       Objective:    Physical Exam Vitals and nursing note reviewed.  Constitutional:      General: She is not in acute  distress.    Appearance: Normal appearance. She is normal weight. She is not ill-appearing, toxic-appearing or diaphoretic.  HENT:     Head: Normocephalic and atraumatic.     Right Ear: External ear normal.     Left Ear: External ear normal.     Nose: No congestion.     Mouth/Throat:     Mouth: Mucous membranes are moist.     Pharynx: Oropharynx is clear. No oropharyngeal exudate or posterior oropharyngeal erythema.  Eyes:     General: No scleral icterus.       Right eye: No discharge.        Left eye: No discharge.     Conjunctiva/sclera: Conjunctivae normal.     Pupils: Pupils are equal, round, and reactive to light.  Neck:     Vascular: No carotid bruit.  Cardiovascular:     Rate and Rhythm: Normal rate and regular rhythm.  Pulmonary:     Effort: Pulmonary effort is normal.     Breath sounds: Normal breath sounds.  Abdominal:     General: Bowel sounds are normal.  Musculoskeletal:     Cervical back: No rigidity or tenderness.     Right lower leg: No edema.     Left lower leg: No edema.  Lymphadenopathy:     Cervical: No cervical adenopathy.  Neurological:     Mental Status: She is alert and oriented to person, place, and time.  Psychiatric:        Mood and Affect: Mood normal.        Behavior: Behavior normal.     BP 128/70   Pulse 75   Temp (!) 97.3 F (36.3 C) (Tympanic)   Ht 5\' 2"  (1.575 m)   Wt 150 lb 6.4 oz (68.2 kg)   SpO2 97%   BMI 27.51 kg/m  Wt Readings from Last 3 Encounters:  10/16/19 150 lb 6.4 oz (68.2 kg)  07/15/19 156 lb (70.8 kg)  06/16/19 153 lb (69.4 kg)     Health Maintenance Due  Topic Date Due  . TETANUS/TDAP  Never done    There are no preventive care reminders to display for this patient.  Lab Results  Component Value Date   TSH 2.07 12/13/2016   Lab Results  Component Value Date   WBC 4.5 04/16/2019   HGB 14.3 04/16/2019   HCT 42.3 04/16/2019   MCV 89.4 04/16/2019   PLT 208.0 04/16/2019   Lab Results  Component  Value Date   NA 137 04/16/2019   K 4.6 04/16/2019   CO2 24 04/16/2019   GLUCOSE 105 (H) 04/16/2019   BUN 18 04/16/2019   CREATININE  0.80 10/08/2019   BILITOT 1.0 07/09/2019   ALKPHOS 120 (H) 07/09/2019   AST 20 07/09/2019   ALT 17 07/09/2019   PROT 6.7 07/09/2019   ALBUMIN 4.1 07/09/2019   CALCIUM 11.2 (H) 08/14/2019   GFR 96.99 04/16/2019   Lab Results  Component Value Date   CHOL 178 04/16/2019   Lab Results  Component Value Date   HDL 39.00 (L) 04/16/2019   Lab Results  Component Value Date   LDLCALC 123 (H) 04/16/2019   Lab Results  Component Value Date   TRIG 83.0 04/16/2019   Lab Results  Component Value Date   CHOLHDL 5 04/16/2019   No results found for: HGBA1C    Assessment & Plan:   Problem List Items Addressed This Visit      Other   Dysthymia   Elevated LDL cholesterol level - Primary   Relevant Orders   Lipid panel   Elevated glucose   Relevant Orders   Basic metabolic panel   Urinalysis, Routine w reflex microscopic      No orders of the defined types were placed in this encounter.   Follow-up: Return in about 6 months (around 04/15/2020), or Good luck with the cataract surgery in the parathyroid glands.Libby Maw, MD

## 2019-11-07 NOTE — Progress Notes (Signed)
[address] [place] [address] Phone: [phone] Fax: [phone]      Your procedure is scheduled on 11/13/19.  Report to [facility] Main Entrance "A" at 5:30 A.M., and check in at the Admitting office.  Call this number if you have problems the morning of surgery:  [phone]  Call [phone] if you have any questions prior to your surgery date Monday-Friday 8am-4pm    Remember:  Do not eat after midnight the night before your surgery  You may drink clear liquids until 4:30 the morning of your surgery.   Clear liquids allowed are: Water, Non-Citrus Juices (without pulp), Carbonated Beverages, Clear Tea, Black Coffee Only, and Gatorade  Please complete your PRE-SURGERY ENSURE that was provided to you by 4:30 the morning of surgery.  Please, if able, drink it in one setting. DO NOT SIP.    Take these medicines the morning of surgery with A SIP OF WATER: prednisoLONE acetate (PRED FORTE)  As of today, STOP taking any Aspirin (unless otherwise instructed by your surgeon) Aleve, Naproxen, Ibuprofen, Motrin, Advil, Goody's, BC's, all herbal medications, fish oil, and all vitamins.                      Do not wear jewelry, make up, or nail polish            Do not wear lotions, powders, perfumes or deodorant.            Do not shave 48 hours prior to surgery.              Do not bring valuables to the hospital.            [facility] is not responsible for any belongings or valuables.  Do NOT Smoke (Tobacco/Vaping) or drink Alcohol 24 hours prior to your procedure If you use a CPAP at night, you may bring all equipment for your overnight stay.   Contacts, glasses, dentures or bridgework may not be worn into surgery.      For patients admitted to the hospital, discharge time will be determined by your treatment team.   Patients discharged the day of surgery will not be allowed to drive home, and  someone needs to stay with them for 24 hours.    Special instructions:   [place]- Preparing For Surgery  Before surgery, you can play an important role. Because skin is not sterile, your skin needs to be as free of germs as possible. You can reduce the number of germs on your skin by washing with CHG (chlorahexidine gluconate) Soap before surgery.  CHG is an antiseptic cleaner which kills germs and bonds with the skin to continue killing germs even after washing.    Oral Hygiene is also important to reduce your risk of infection.  Remember - BRUSH YOUR TEETH THE MORNING OF SURGERY WITH YOUR REGULAR TOOTHPASTE  Please do not use if you have an allergy to CHG or antibacterial soaps. If your skin becomes reddened/irritated stop using the CHG.  Do not shave (including legs and underarms) for at least 48 hours prior to first CHG shower. It is OK to shave your face.  Please follow these instructions carefully.   1. Shower the NIGHT BEFORE SURGERY and the MORNING OF SURGERY with CHG Soap.   2. If you chose to wash your hair, wash your hair first as usual with your normal  shampoo.  3. After you shampoo, rinse your hair and body thoroughly to remove the shampoo.  4. Use CHG as you would any other liquid soap. You can apply CHG directly to the skin and wash gently with a scrungie or a clean washcloth.   5. Apply the CHG Soap to your body ONLY FROM THE NECK DOWN.  Do not use on open wounds or open sores. Avoid contact with your eyes, ears, mouth and genitals (private parts). Wash Face and genitals (private parts)  with your normal soap.   6. Wash thoroughly, paying special attention to the area where your surgery will be performed.  7. Thoroughly rinse your body with warm water from the neck down.  8. DO NOT shower/wash with your normal soap after using and rinsing off the CHG Soap.  9. Pat yourself dry with a CLEAN TOWEL.  10. Wear CLEAN PAJAMAS to bed the night before  surgery  11. Place CLEAN SHEETS on your bed the night of your first shower and DO NOT SLEEP WITH PETS.   Day of Surgery: Wear Clean/Comfortable clothing the morning of surgery Do not apply any deodorants/lotions.   Remember to brush your teeth WITH YOUR REGULAR TOOTHPASTE.   Please read over the following fact sheets that you were given.

## 2019-11-10 ENCOUNTER — Other Ambulatory Visit: Payer: Self-pay

## 2019-11-10 ENCOUNTER — Encounter (HOSPITAL_COMMUNITY): Payer: Self-pay

## 2019-11-10 ENCOUNTER — Encounter (HOSPITAL_COMMUNITY)
Admission: RE | Admit: 2019-11-10 | Discharge: 2019-11-10 | Disposition: A | Discharge status: Home / Self Care | Payer: PPO | Source: Ambulatory Visit | Attending: General Surgery | Admitting: General Surgery

## 2019-11-10 ENCOUNTER — Other Ambulatory Visit (HOSPITAL_COMMUNITY)
Admission: RE | Admit: 2019-11-10 | Discharge: 2019-11-10 | Disposition: A | Discharge status: Home / Self Care | Payer: PPO | Source: Ambulatory Visit | Attending: General Surgery | Admitting: General Surgery

## 2019-11-10 DIAGNOSIS — Z01818 Encounter for other preprocedural examination: Secondary | ICD-10-CM | POA: Insufficient documentation

## 2019-11-10 DIAGNOSIS — Z20822 Contact with and (suspected) exposure to covid-19: Secondary | ICD-10-CM | POA: Insufficient documentation

## 2019-11-10 LAB — SARS CORONAVIRUS 2 (TAT 6-24 HRS): SARS Coronavirus 2: NEGATIVE

## 2019-11-10 LAB — CBC
HCT: 41.7 % (ref 36.0–46.0)
Hemoglobin: 13.5 g/dL (ref 12.0–15.0)
MCH: 29.7 pg (ref 26.0–34.0)
MCHC: 32.4 g/dL (ref 30.0–36.0)
MCV: 91.6 fL (ref 80.0–100.0)
Platelets: 232 10*3/uL (ref 150–400)
RBC: 4.55 MIL/uL (ref 3.87–5.11)
RDW: 12.3 % (ref 11.5–15.5)
WBC: 4.6 10*3/uL (ref 4.0–10.5)
nRBC: 0 % (ref 0.0–0.2)

## 2019-11-10 NOTE — Progress Notes (Signed)
PCP:  Abelino Derrick, MD Cardiologist:  Denies  EKG:  N/A CXR:  N/A ECHO: Denies Stress Test:  Denies Cardiac Cath:  Denies  Fasting Blood Sugar-  N/A Checks Blood Sugar_N/A__ times a day  ASA/Blood Thinners:  NO  OSA/CPAP:  NO  Covid test 11/10/19  Anesthesia Review:  NO  Patient denies shortness of breath, fever, cough, and chest pain at PAT appointment.  Patient verbalized understanding of instructions provided today at the PAT appointment.  Patient asked to review instructions at home and day of surgery.

## 2019-11-12 NOTE — Anesthesia Preprocedure Evaluation (Addendum)
Anesthesia Evaluation  Patient identified by MRN, date of birth, ID band Patient awake    Reviewed: Allergy & Precautions, NPO status , Patient's Chart, lab work & pertinent test results  Airway Mallampati: III  TM Distance: >3 FB Neck ROM: Full    Dental no notable dental hx.    Pulmonary neg pulmonary ROS,    Pulmonary exam normal breath sounds clear to auscultation       Cardiovascular negative cardio ROS Normal cardiovascular exam Rhythm:Regular Rate:Normal     Neuro/Psych PSYCHIATRIC DISORDERS Depression negative neurological ROS     GI/Hepatic negative GI ROS, Neg liver ROS,   Endo/Other  negative endocrine ROS  Renal/GU negative Renal ROS     Musculoskeletal negative musculoskeletal ROS (+)   Abdominal   Peds  Hematology negative hematology ROS (+)   Anesthesia Other Findings HYPERPARATHYROIDISM  Reproductive/Obstetrics                            Anesthesia Physical Anesthesia Plan  ASA: II  Anesthesia Plan: General   Post-op Pain Management:    Induction: Intravenous  PONV Risk Score and Plan: 4 or greater and Midazolam, Dexamethasone, Ondansetron, Propofol infusion and Treatment may vary due to age or medical condition  Airway Management Planned: Oral ETT  Additional Equipment:   Intra-op Plan:   Post-operative Plan: Extubation in OR  Informed Consent: I have reviewed the patients History and Physical, chart, labs and discussed the procedure including the risks, benefits and alternatives for the proposed anesthesia with the patient or authorized representative who has indicated his/her understanding and acceptance.     Dental advisory given  Plan Discussed with: CRNA  Anesthesia Plan Comments:        Anesthesia Quick Evaluation

## 2019-11-13 ENCOUNTER — Encounter (HOSPITAL_COMMUNITY)
Admission: RE | Disposition: A | Discharge status: Home / Self Care | Payer: Self-pay | Source: Home / Self Care | Attending: General Surgery

## 2019-11-13 ENCOUNTER — Ambulatory Visit (HOSPITAL_COMMUNITY): Payer: PPO | Admitting: Anesthesiology

## 2019-11-13 ENCOUNTER — Other Ambulatory Visit: Payer: Self-pay

## 2019-11-13 ENCOUNTER — Observation Stay (HOSPITAL_COMMUNITY)
Admission: RE | Admit: 2019-11-13 | Discharge: 2019-11-14 | Disposition: A | Discharge status: Home / Self Care | Payer: PPO | Attending: General Surgery | Admitting: General Surgery

## 2019-11-13 ENCOUNTER — Encounter (HOSPITAL_COMMUNITY): Payer: Self-pay | Admitting: General Surgery

## 2019-11-13 DIAGNOSIS — E213 Hyperparathyroidism, unspecified: Secondary | ICD-10-CM | POA: Diagnosis not present

## 2019-11-13 DIAGNOSIS — E7800 Pure hypercholesterolemia, unspecified: Secondary | ICD-10-CM | POA: Diagnosis not present

## 2019-11-13 DIAGNOSIS — E210 Primary hyperparathyroidism: Secondary | ICD-10-CM | POA: Diagnosis not present

## 2019-11-13 DIAGNOSIS — E892 Postprocedural hypoparathyroidism: Secondary | ICD-10-CM

## 2019-11-13 DIAGNOSIS — E559 Vitamin D deficiency, unspecified: Secondary | ICD-10-CM | POA: Diagnosis not present

## 2019-11-13 HISTORY — PX: PARATHYROIDECTOMY: SHX19

## 2019-11-13 SURGERY — PARATHYROIDECTOMY
Anesthesia: General | Site: Neck | Laterality: Left

## 2019-11-13 MED ORDER — HEMOSTATIC AGENTS (NO CHARGE) OPTIME
TOPICAL | Status: DC | PRN
  Administered 2019-11-13: 1 via TOPICAL

## 2019-11-13 MED ORDER — DEXAMETHASONE SODIUM PHOSPHATE 10 MG/ML IJ SOLN
INTRAMUSCULAR | Status: DC | PRN
  Administered 2019-11-13: 5 mg via INTRAVENOUS

## 2019-11-13 MED ORDER — MIDAZOLAM HCL 5 MG/5ML IJ SOLN
INTRAMUSCULAR | Status: DC | PRN
  Administered 2019-11-13: 2 mg via INTRAVENOUS

## 2019-11-13 MED ORDER — WHITE PETROLATUM EX OINT
TOPICAL_OINTMENT | CUTANEOUS | Status: AC
  Administered 2019-11-13: 0.2
  Filled 2019-11-13: qty 28.35

## 2019-11-13 MED ORDER — ENSURE PRE-SURGERY PO LIQD: 296.0000 mL | Freq: Once | ORAL | Status: DC

## 2019-11-13 MED ORDER — PROPOFOL 10 MG/ML IV BOLUS
INTRAVENOUS | Status: DC | PRN
  Administered 2019-11-13: 150 mg via INTRAVENOUS

## 2019-11-13 MED ORDER — DEXTROSE-NACL 5-0.9 % IV SOLN: INTRAVENOUS | Status: DC

## 2019-11-13 MED ORDER — BUPIVACAINE HCL (PF) 0.25 % IJ SOLN
INTRAMUSCULAR | Status: AC
  Filled 2019-11-13: qty 30

## 2019-11-13 MED ORDER — FENTANYL CITRATE (PF) 250 MCG/5ML IJ SOLN
INTRAMUSCULAR | Status: AC
  Filled 2019-11-13: qty 5

## 2019-11-13 MED ORDER — CEFAZOLIN SODIUM-DEXTROSE 2-4 GM/100ML-% IV SOLN
2.0000 g | INTRAVENOUS | Status: AC
  Administered 2019-11-13: 2 g via INTRAVENOUS
  Filled 2019-11-13: qty 100

## 2019-11-13 MED ORDER — FENTANYL CITRATE (PF) 100 MCG/2ML IJ SOLN: 25.0000 ug | INTRAMUSCULAR | Status: DC | PRN

## 2019-11-13 MED ORDER — CHLORHEXIDINE GLUCONATE CLOTH 2 % EX PADS: 6.0000 | MEDICATED_PAD | Freq: Once | CUTANEOUS | Status: DC

## 2019-11-13 MED ORDER — KETOROLAC TROMETHAMINE 15 MG/ML IJ SOLN
15.0000 mg | Freq: Once | INTRAMUSCULAR | Status: AC | PRN
  Administered 2019-11-13: 15 mg via INTRAVENOUS

## 2019-11-13 MED ORDER — ACETAMINOPHEN 500 MG PO TABS
1000.0000 mg | ORAL_TABLET | ORAL | Status: AC
  Administered 2019-11-13: 1000 mg via ORAL
  Filled 2019-11-13: qty 2

## 2019-11-13 MED ORDER — LIDOCAINE 2% (20 MG/ML) 5 ML SYRINGE
INTRAMUSCULAR | Status: DC | PRN
  Administered 2019-11-13: 60 mg via INTRAVENOUS

## 2019-11-13 MED ORDER — SUGAMMADEX SODIUM 200 MG/2ML IV SOLN
INTRAVENOUS | Status: DC | PRN
  Administered 2019-11-13: 200 mg via INTRAVENOUS

## 2019-11-13 MED ORDER — ORAL CARE MOUTH RINSE: 15.0000 mL | Freq: Once | OROMUCOSAL | Status: AC

## 2019-11-13 MED ORDER — LACTATED RINGERS IV SOLN: INTRAVENOUS | Status: DC

## 2019-11-13 MED ORDER — PROPOFOL 10 MG/ML IV BOLUS
INTRAVENOUS | Status: AC
  Filled 2019-11-13: qty 20

## 2019-11-13 MED ORDER — OXYCODONE HCL 5 MG PO TABS
ORAL_TABLET | ORAL | Status: AC
  Filled 2019-11-13: qty 2

## 2019-11-13 MED ORDER — PHENOL 1.4 % MT LIQD: 1.0000 | OROMUCOSAL | Status: DC | PRN

## 2019-11-13 MED ORDER — FENTANYL CITRATE (PF) 250 MCG/5ML IJ SOLN
INTRAMUSCULAR | Status: DC | PRN
  Administered 2019-11-13: 100 ug via INTRAVENOUS
  Administered 2019-11-13 (×2): 50 ug via INTRAVENOUS

## 2019-11-13 MED ORDER — ONDANSETRON 4 MG PO TBDP: 4.0000 mg | ORAL_TABLET | Freq: Four times a day (QID) | ORAL | Status: DC | PRN

## 2019-11-13 MED ORDER — OXYCODONE HCL 5 MG PO TABS
5.0000 mg | ORAL_TABLET | ORAL | Status: DC | PRN
  Administered 2019-11-13: 10 mg via ORAL

## 2019-11-13 MED ORDER — MIDAZOLAM HCL 2 MG/2ML IJ SOLN
INTRAMUSCULAR | Status: AC
  Filled 2019-11-13: qty 2

## 2019-11-13 MED ORDER — BUPIVACAINE HCL (PF) 0.25 % IJ SOLN
INTRAMUSCULAR | Status: DC | PRN
  Administered 2019-11-13: 7 mL

## 2019-11-13 MED ORDER — ROCURONIUM BROMIDE 10 MG/ML (PF) SYRINGE
PREFILLED_SYRINGE | INTRAVENOUS | Status: DC | PRN
  Administered 2019-11-13: 50 mg via INTRAVENOUS
  Administered 2019-11-13: 20 mg via INTRAVENOUS

## 2019-11-13 MED ORDER — KETOROLAC TROMETHAMINE 15 MG/ML IJ SOLN
INTRAMUSCULAR | Status: AC
  Filled 2019-11-13: qty 1

## 2019-11-13 MED ORDER — ONDANSETRON HCL 4 MG/2ML IJ SOLN: 4.0000 mg | Freq: Four times a day (QID) | INTRAMUSCULAR | Status: DC | PRN

## 2019-11-13 MED ORDER — TRAMADOL HCL 50 MG PO TABS: 50.0000 mg | ORAL_TABLET | Freq: Four times a day (QID) | ORAL | Status: DC | PRN

## 2019-11-13 MED ORDER — ONDANSETRON HCL 4 MG/2ML IJ SOLN
4.0000 mg | Freq: Once | INTRAMUSCULAR | Status: AC | PRN
  Administered 2019-11-13: 4 mg via INTRAVENOUS

## 2019-11-13 MED ORDER — PHENYLEPHRINE 40 MCG/ML (10ML) SYRINGE FOR IV PUSH (FOR BLOOD PRESSURE SUPPORT)
PREFILLED_SYRINGE | INTRAVENOUS | Status: DC | PRN
  Administered 2019-11-13 (×2): 80 ug via INTRAVENOUS
  Administered 2019-11-13: 40 ug via INTRAVENOUS
  Administered 2019-11-13 (×3): 80 ug via INTRAVENOUS
  Administered 2019-11-13: 40 ug via INTRAVENOUS
  Administered 2019-11-13: 80 ug via INTRAVENOUS

## 2019-11-13 MED ORDER — CHLORHEXIDINE GLUCONATE 0.12 % MT SOLN
15.0000 mL | Freq: Once | OROMUCOSAL | Status: AC
  Administered 2019-11-13: 15 mL via OROMUCOSAL
  Filled 2019-11-13: qty 15

## 2019-11-13 SURGICAL SUPPLY — 46 items
CANISTER SUCT 3000ML PPV (MISCELLANEOUS) ×2 IMPLANT
CHLORAPREP W/TINT 26 (MISCELLANEOUS) ×2 IMPLANT
CLIP VESOCCLUDE MED 6/CT (CLIP) ×2 IMPLANT
CLIP VESOCCLUDE SM WIDE 6/CT (CLIP) ×2 IMPLANT
CNTNR URN SCR LID CUP LEK RST (MISCELLANEOUS) ×1 IMPLANT
CONT SPEC 4OZ STRL OR WHT (MISCELLANEOUS) ×1
COVER SURGICAL LIGHT HANDLE (MISCELLANEOUS) ×2 IMPLANT
COVER WAND RF STERILE (DRAPES) ×2 IMPLANT
DERMABOND ADHESIVE PROPEN (GAUZE/BANDAGES/DRESSINGS) ×1
DERMABOND ADVANCED (GAUZE/BANDAGES/DRESSINGS) ×1
DERMABOND ADVANCED .7 DNX12 (GAUZE/BANDAGES/DRESSINGS) ×1 IMPLANT
DERMABOND ADVANCED .7 DNX6 (GAUZE/BANDAGES/DRESSINGS) ×1 IMPLANT
DRAPE LAPAROTOMY 100X72 PEDS (DRAPES) ×2 IMPLANT
DRAPE SLUSH/WARMER DISC (DRAPES) IMPLANT
ELECT COATED BLADE 2.86 ST (ELECTRODE) ×2 IMPLANT
ELECT REM PT RETURN 9FT ADLT (ELECTROSURGICAL) ×2
ELECTRODE REM PT RTRN 9FT ADLT (ELECTROSURGICAL) ×1 IMPLANT
GAUZE 4X4 16PLY RFD (DISPOSABLE) ×2 IMPLANT
GAUZE SPONGE 2X2 8PLY STRL LF (GAUZE/BANDAGES/DRESSINGS) ×1 IMPLANT
GLOVE BIO SURGEON STRL SZ7.5 (GLOVE) ×2 IMPLANT
GLOVE BIOGEL PI IND STRL 8 (GLOVE) IMPLANT
GLOVE BIOGEL PI INDICATOR 8 (GLOVE)
GOWN STRL REUS W/ TWL LRG LVL3 (GOWN DISPOSABLE) ×2 IMPLANT
GOWN STRL REUS W/TWL LRG LVL3 (GOWN DISPOSABLE) ×2
HEMOSTAT SURGICEL 2X4 FIBR (HEMOSTASIS) ×4 IMPLANT
ILLUMINATOR WAVEGUIDE N/F (MISCELLANEOUS) ×2 IMPLANT
KIT BASIN OR (CUSTOM PROCEDURE TRAY) ×2 IMPLANT
KIT TURNOVER KIT B (KITS) ×2 IMPLANT
NEEDLE HYPO 25GX1X1/2 BEV (NEEDLE) IMPLANT
NS IRRIG 1000ML POUR BTL (IV SOLUTION) ×2 IMPLANT
PACK GENERAL/GYN (CUSTOM PROCEDURE TRAY) ×2 IMPLANT
PAD ARMBOARD 7.5X6 YLW CONV (MISCELLANEOUS) ×4 IMPLANT
PENCIL SMOKE EVACUATOR (MISCELLANEOUS) ×2 IMPLANT
SPONGE GAUZE 2X2 STER 10/PKG (GAUZE/BANDAGES/DRESSINGS) ×1
SPONGE INTESTINAL PEANUT (DISPOSABLE) ×2 IMPLANT
STRIP CLOSURE SKIN 1/2X4 (GAUZE/BANDAGES/DRESSINGS) ×2 IMPLANT
SUT MNCRL AB 4-0 PS2 18 (SUTURE) ×2 IMPLANT
SUT SILK 2 0 (SUTURE)
SUT SILK 2-0 18XBRD TIE 12 (SUTURE) IMPLANT
SUT SILK 3 0 (SUTURE)
SUT SILK 3-0 18XBRD TIE 12 (SUTURE) IMPLANT
SUT VIC AB 3-0 SH 18 (SUTURE) ×2 IMPLANT
SYR BULB IRRIG 60ML STRL (SYRINGE) ×2 IMPLANT
SYR CONTROL 10ML LL (SYRINGE) IMPLANT
TOWEL GREEN STERILE (TOWEL DISPOSABLE) ×2 IMPLANT
TOWEL GREEN STERILE FF (TOWEL DISPOSABLE) ×2 IMPLANT

## 2019-11-13 NOTE — Anesthesia Procedure Notes (Signed)
Procedure Name: Intubation Date/Time: 11/13/2019 7:31 AM Performed by: Amadeo Garnet, CRNA Pre-anesthesia Checklist: Patient identified, Emergency Drugs available, Suction available and Patient being monitored Patient Re-evaluated:Patient Re-evaluated prior to induction Oxygen Delivery Method: Circle system utilized Preoxygenation: Pre-oxygenation with 100% oxygen Induction Type: IV induction Ventilation: Mask ventilation without difficulty Laryngoscope Size: Mac and 4 Grade View: Grade II Tube type: Oral Tube size: 7.0 mm Number of attempts: 1 Airway Equipment and Method: Stylet and Oral airway Placement Confirmation: ETT inserted through vocal cords under direct vision,  positive ETCO2 and breath sounds checked- equal and bilateral Secured at: 22 cm Tube secured with: Tape Dental Injury: Teeth and Oropharynx as per pre-operative assessment

## 2019-11-13 NOTE — Anesthesia Postprocedure Evaluation (Signed)
Anesthesia Post Note  Patient: Vicki George  Procedure(s) Performed: LEFT PARATHYROIDECTOMY (Left Neck)     Patient location during evaluation: PACU Anesthesia Type: General Level of consciousness: awake Pain management: pain level controlled Vital Signs Assessment: post-procedure vital signs reviewed and stable Respiratory status: spontaneous breathing, nonlabored ventilation, respiratory function stable and patient connected to nasal cannula oxygen Cardiovascular status: blood pressure returned to baseline and stable Postop Assessment: no apparent nausea or vomiting Anesthetic complications: no   No complications documented.  Last Vitals:  Vitals:   11/13/19 1348 11/13/19 1400  BP: 107/68 119/72  Pulse: 67 66  Resp: 13 16  Temp:  36.6 C  SpO2: 100% 100%    Last Pain:  Vitals:   11/13/19 1400  TempSrc: Oral  PainSc: 0-No pain                 Bentleigh Waren P Chellie Vanlue

## 2019-11-13 NOTE — Transfer of Care (Signed)
Immediate Anesthesia Transfer of Care Note  Patient: Vicki George  Procedure(s) Performed: PARATHYROIDECTOMY (N/A Neck)  Patient Location: PACU  Anesthesia Type:General  Level of Consciousness: awake, alert  and oriented  Airway & Oxygen Therapy: Patient Spontanous Breathing  Post-op Assessment: Report given to RN, Post -op Vital signs reviewed and stable and Patient moving all extremities  Post vital signs: Reviewed and stable  Last Vitals:  Vitals Value Taken Time  BP 135/83 11/13/19 0918  Temp    Pulse 83 11/13/19 0922  Resp 10 11/13/19 0922  SpO2 93 % 11/13/19 0922  Vitals shown include unvalidated device data.  Last Pain:  Vitals:   11/13/19 0614  PainSc: 0-No pain         Complications: No complications documented.

## 2019-11-13 NOTE — H&P (Signed)
History of Present Illness The patient is a 64 year old female who presents with a complaint of primary hyperparathyroidism. Patient is a 64 year old female who comes in secondary to a history of hyper calcium, hyperparathyroidism. Patient was referred by Dr. Loanne Drilling.  Patient has had hypo-vitamin D, this was supplemented and her vitamin D is currently normal. At the time patient as she had elevated calcium as well as PTH levels. These continue to remain elevated. Her PTH level recently in August 2021 was 133. Her vitamin D was 38.9 which is normal. Her calcium level was 11.2. This appears to be fairly static and elevated.  Patient had no symptoms of hypercalcemia, has had no recent pathological fractures, or kidney stones. He's had no cramping, GI dysfunction.  She's had no imaging studies.   Past Surgical History  No pertinent past surgical history   Diagnostic Studies History  Colonoscopy  never Mammogram  1-3 years ago Pap Smear  1-5 years ago  Allergies  Aspirin *ANALGESICS - NonNarcotic*  Allergies Reconciled   Medication History Vitamin D (Oral) Specific strength unknown - Active. Vitamin B Complex (Oral) Active. Vitamin C (Oral) Specific strength unknown - Active. Medications Reconciled  Pregnancy / Birth History  Age at menarche  17 years. Age of menopause  33-50 Gravida  1 Maternal age  >25  Other Problems  Anxiety Disorder     Review of Systems  General Not Present- Appetite Loss, Chills, Fatigue, Fever, Night Sweats, Weight Gain and Weight Loss. Skin Not Present- Change in Wart/Mole, Dryness, Hives, Jaundice, New Lesions, Non-Healing Wounds, Rash and Ulcer. HEENT Present- Seasonal Allergies, Visual Disturbances and Wears glasses/contact lenses. Not Present- Earache, Hearing Loss, Hoarseness, Nose Bleed, Oral Ulcers, Ringing in the Ears, Sinus Pain, Sore Throat and Yellow Eyes. Breast Not Present- Breast Mass, Breast Pain,  Nipple Discharge and Skin Changes. Cardiovascular Present- Leg Cramps. Not Present- Chest Pain, Difficulty Breathing Lying Down, Palpitations, Rapid Heart Rate, Shortness of Breath and Swelling of Extremities. Female Genitourinary Not Present- Frequency, Nocturia, Painful Urination, Pelvic Pain and Urgency. Musculoskeletal Not Present- Back Pain, Joint Pain, Joint Stiffness, Muscle Pain, Muscle Weakness and Swelling of Extremities. Neurological Not Present- Decreased Memory, Fainting, Headaches, Numbness, Seizures, Tingling, Tremor, Trouble walking and Weakness. Psychiatric Present- Anxiety. Not Present- Bipolar, Change in Sleep Pattern, Depression, Fearful and Frequent crying. Endocrine Present- Hot flashes. Not Present- Cold Intolerance, Excessive Hunger, Hair Changes, Heat Intolerance and New Diabetes. Hematology Present- Gland problems. Not Present- Blood Thinners, Easy Bruising, Excessive bleeding, HIV and Persistent Infections.  BP 134/81   Pulse 86   Temp 98.7 F (37.1 C)   Resp 18   Ht 5\' 2"  (1.575 m)   Wt 69.1 kg   SpO2 99%   BMI 27.86 kg/m       Physical Exam  The physical exam findings are as follows: Note: Constitutional: No acute distress, conversant, appears stated age  Eyes: Anicteric sclerae, moist conjunctiva, no lid lag  Neck: No thyromegaly, trachea midline, no cervical lymphadenopathy  Lungs: Clear to auscultation biilaterally, normal respiratory effot  Cardiovascular: regular rate & rhythm, no murmurs, no peripheal edema, pedal pulses 2+  GI: Soft, no masses or hepatosplenomegaly, non-tender to palpation  MSK: Normal gait, no clubbing cyanosis, edema  Skin: No rashes, palpation reveals normal skin turgor  Psychiatric: Appropriate judgment and insight, oriented to person, place, and time    Assessment & Plan  PRIMARY HYPERPARATHYROIDISM (E21.0) Impression: 64 year old female with 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 We  will obtain an ultrasound and sestamibi scan for localization. 2. We'll have her scheduled for minimally invasive parathyroidectomy after the adenomas was localized. 3. Did discuss with her the risks and benefits of the procedure to include but not limited to: Infection, bleeding, damage to any structures, in particular the recurrent laryngeal nerve, possible need for further tests and/or surgery, as well as need for IV calcium supplementation.

## 2019-11-13 NOTE — Discharge Instructions (Signed)
Parathyroidectomy, Care After This sheet gives you information about how to care for yourself after your procedure. Your health care provider may also give you more specific instructions. If you have problems or questions, contact your health care provider. What can I expect after the procedure? After the procedure, it is common to have:  Mild pain in the neck or upper body, especially when swallowing.  A swollen neck.  A sore throat.  A weak or hoarse voice.  Slight tingling or numbness around your mouth, or in your fingers or toes. This may last for a day or two after surgery. This condition is caused by low levels of calcium. You may be given calcium supplements to treat it. Follow these instructions at home:  Medicines  Take over-the-counter and prescription medicines only as told by your health care provider.  Do not drive or use heavy machinery while taking prescription pain medicine.  Do not take medicines that contain aspirin and ibuprofen until your health care provider says that you can. These medicines can increase your risk of bleeding. Eating and drinking  Follow instructions from your health care provider about eating or drinking restrictions. You may need to have only liquids and soft foods for a day after the procedure.  To prevent or treat constipation while you are taking prescription pain medicine, your health care provider may recommend that you: ? Drink enough fluid to keep your urine pale yellow. ? Take over-the-counter or prescription medicines. ? Eat foods that are high in fiber, such as fresh fruits and vegetables, whole grains, and beans. ? Limit foods that are high in fat and processed sugars, such as fried and sweet foods. Incision care  Follow instructions from your health care provider about how to take care of your incision. Make sure you: ? Wash your hands with soap and water before you change your bandage (dressing). If soap and water are not  available, use hand sanitizer. ? Change your dressing as told by your health care provider. ? Leave stitches (sutures), skin glue, or adhesive strips in place. These skin closures may need to stay in place for 2 weeks or longer. If adhesive strip edges start to loosen and curl up, you may trim the loose edges. Do not remove adhesive strips completely unless your health care provider tells you to do that.  Check your incision area every day for signs of infection. Check for: ? Redness, swelling, or pain. ? Fluid or blood. ? Warmth. ? Pus or a bad smell.  Do not take baths, swim, or use a hot tub until your health care provider approves. Activity  For the first 10 days after the procedure or as instructed by your health care provider: ? Do not lift anything that is heavier than 10 lb (4.5 kg). ? Do not jog, swim, or do other strenuous exercises. ? Do not play contact sports.  Avoid sitting for a long time without moving. Get up to take short walks every 1-2 hours. This is important to improve blood flow and breathing. Ask for help if you feel weak or unsteady.  Return to your normal activities as told by your health care provider. Ask your health care provider what activities are safe for you. General instructions  Do not use any products that contain nicotine or tobacco, such as cigarettes and e-cigarettes. These can delay healing after surgery. If you need help quitting, ask your health care provider.  Keep all follow-up visits as told by your health care  provider. This is important. Your health care provider needs to monitor the calcium level in your blood to make sure that it does not become low. Contact a health care provider if you:  Have a fever.  Have more redness, swelling, or pain around your incision area.  Have fluid or blood coming from your incision area.  Notice that your incision area feels warm to the touch.  Have pus or a bad smell coming from your incision  area.  Have trouble talking.  Have nausea or vomiting for more than 2 days. Get help right away if you:  Have trouble breathing.  Have trouble swallowing.  Develop a rash.  Develop a cough that gets worse.  Notice that your speech changes, or you have hoarseness that gets worse.  Develop numbness, tingling, or muscle spasms in the arms, hands, feet, or face. Summary  For a day or two after the procedure, you may have tingling or numbness around your mouth, or in your fingers or toes. Temporary hoarseness may also occur.  Follow instructions from your health care provider about how to take care of your incision. Watch for signs of infection.  Keep all follow-up visits as told by your health care provider. This is important. Your health care provider needs to monitor the calcium level in your blood to make sure that it does not become low.  Get help right away if you develop difficulty breathing, or numbness, tingling, or muscle spasms in the arms, hands, feet, or face. This information is not intended to replace advice given to you by your health care provider. Make sure you discuss any questions you have with your health care provider. Document Revised: 12/01/2016 Document Reviewed: 10/24/2016 Elsevier Patient Education  2020 [employer].

## 2019-11-13 NOTE — Op Note (Signed)
11/13/2019  9:02 AM  PATIENT:  Vicki George  64 y.o. female  PRE-OPERATIVE DIAGNOSIS:  HYPERPARATHYROIDISM  POST-OPERATIVE DIAGNOSIS:  HYPERPARATHYROIDISM  PROCEDURE:  Procedure(s): PARATHYROIDECTOMY (N/A)  SURGEON:  Surgeon(s) and Role:    * Ralene Ok, MD - Primary    Armandina Gemma, MD-Who was essential at assisting at retraction, identification of the anatomy.  ANESTHESIA:   local and general  EBL:  minimal   BLOOD ADMINISTERED:none  DRAINS: none   LOCAL MEDICATIONS USED:  BUPIVICAINE   SPECIMEN:  Source of Specimen:  L parathyroid gland  DISPOSITION OF SPECIMEN:  PATHOLOGY  COUNTS:  YES  TOURNIQUET:  * No tourniquets in log *  DICTATION: .Dragon Dictation Details of the procedure:  The patient was taken back to the operating room. The patient was placed in supine position with bilateral SCDs in place.  The patient was prepped and draped in the usual sterile fashion. After appropriate anitbiotics were confirmed, a time-out was confirmed and all facts were verified. A left-sided 3 cm incision was made approximately 2 fingerbreadths above the sternal notch. Bovie cautery was used to maintain hemostasis dissection was carried down through the platysma. The platysma was elevated and flaps were created superiorly and inferiorly to the thyroid cartilage as well as the sternal notch, repsectively. The strap muscles were identified in the midline and separated. Left-sided strap muscles were elevated off the anterior surface of the thyroid. This dissection was carried laterally. We proceeded to dissect away the left thyroid lobe with Kitners from the surrounding musculature from the thyroid.   Upon dissecting the left inferior gland, an enlarge parathyroid gland was identified.This was dissected out from the surrounding tissue.  Once it was removed it was sent to Pathology for a frozen section confirmation of parathyroid tissue. Two other candidates were also sent for frozen  pathology and returned as thyroid tissue.   The area was irrigated out. The dissection bed was hemostatic. We placed fibrillar hemostatic agent into the wound. Strap muscles were then reapproximated in the midline with interrupted 3-0 Vicryl stitches. The platysma was reapproximated using 3-0 Vicryl stitches in interrupted fashion. Skin was then reapproximated using a running subcuticular 4-0 Monocryl. The skin was then dressed with Dermabond.   Intraoperatively, Pathology confirmed that the specimen was indeed enlarge parathyroid tissue.  The patient was taken to the recovery room in stable condition.   PLAN OF CARE: Admit for overnight observation  PATIENT DISPOSITION:  PACU - hemodynamically stable.   Delay start of Pharmacological VTE agent (>24hrs) due to surgical blood loss or risk of bleeding: yes

## 2019-11-14 ENCOUNTER — Encounter (HOSPITAL_COMMUNITY): Payer: Self-pay | Admitting: General Surgery

## 2019-11-14 ENCOUNTER — Other Ambulatory Visit: Payer: Self-pay

## 2019-11-14 DIAGNOSIS — E210 Primary hyperparathyroidism: Secondary | ICD-10-CM | POA: Diagnosis not present

## 2019-11-14 LAB — BASIC METABOLIC PANEL
Anion gap: 5 (ref 5–15)
BUN: 12 mg/dL (ref 8–23)
CO2: 25 mmol/L (ref 22–32)
Calcium: 10.8 mg/dL — ABNORMAL HIGH (ref 8.9–10.3)
Chloride: 107 mmol/L (ref 98–111)
Creatinine, Ser: 0.72 mg/dL (ref 0.44–1.00)
GFR, Estimated: >60 mL/min (ref >60)
Glucose, Bld: 101 mg/dL — ABNORMAL HIGH (ref 70–99)
Potassium: 3.9 mmol/L (ref 3.5–5.1)
Sodium: 137 mmol/L (ref 135–145)

## 2019-11-14 LAB — SURGICAL PATHOLOGY

## 2019-11-14 MED ORDER — TRAMADOL HCL 50 MG PO TABS: 50.0000 mg | ORAL_TABLET | Freq: Four times a day (QID) | ORAL | 0 refills | Status: DC | PRN

## 2019-11-14 MED ORDER — MAGNESIUM OXIDE 400 (241.3 MG) MG PO TABS: 400.0000 mg | ORAL_TABLET | Freq: Every day | ORAL | 1 refills | Status: DC

## 2019-11-14 MED ORDER — CALCIUM CARBONATE 600 MG PO TABS: 600.0000 mg | ORAL_TABLET | Freq: Two times a day (BID) | ORAL | 1 refills | Status: DC

## 2019-11-14 NOTE — Discharge Summary (Signed)
Physician Discharge Summary  Patient ID: Vicki George MRN: [id number] DOB/AGE: Feb 24, 1955 64 y.o.  Admit date: 11/13/2019 Discharge date: 11/14/2019  Admission Diagnoses:primary hyperPTH  Discharge Diagnoses:  Active Problems:   S/P parathyroidectomy [facility])   Discharged Condition: good  Hospital Course: Pt was admitted post op.  Please see op note for full details. Pt was started on a liq diet and adv to a reg diet and tol that well.  She had good pain control.  Ca levels were declining approp after surgery.  She was deemed stable for Dc and Dc'd home.  Consults: None  Significant Diagnostic Studies: none  Treatments: surgery: as above  Discharge Exam: Blood pressure 111/66, pulse 84, temperature 98.6 F (37 C), temperature source Oral, resp. rate 19, height 5\' 2"  (1.575 m), weight 69.1 kg, SpO2 100 %. General appearance: alert and cooperative Incision/Wound: inc c/d/i   Disposition: Discharge disposition: 01-Home or Self Care       Discharge Instructions    Diet - low sodium heart healthy   Complete by: As directed    Increase activity slowly   Complete by: As directed      Allergies as of 11/14/2019      Reactions   Aspirin Other (See Comments)   Upset stomach      Medication List    STOP taking these medications   PARoxetine 10 MG tablet Commonly known as: Paxil     TAKE these medications   b complex vitamins capsule Take 1 capsule by mouth daily.   calcium carbonate 600 MG Tabs tablet Commonly known as: Calcium 600 Take 1 tablet (600 mg total) by mouth 2 (two) times daily with a meal.   HM Vitamin D3 100 MCG (4000 UT) Caps Generic drug: Cholecalciferol Take 4,000 Units by mouth daily.   magnesium oxide 400 (241.3 Mg) MG tablet Commonly known as: MagOx 400 Take 1 tablet (400 mg total) by mouth daily.   prednisoLONE acetate 1 % ophthalmic suspension Commonly known as: PRED FORTE Place 1 drop into both eyes 4 (four) times daily.   traMADol  50 MG tablet Commonly known as: ULTRAM Take 1 tablet (50 mg total) by mouth every 6 (six) hours as needed (mild pain).   Vitamin C 500 MG Caps Take 500 mg by mouth daily.       Follow-up Information    Ralene Ok, MD. Schedule an appointment as soon as possible for a visit in 2 weeks.   Specialty: General Surgery Why: Post op visit Contact information: [address] [place] [address] [phone]               Signed: Ralene Ok 11/14/2019, 6:50 AM

## 2019-11-14 NOTE — Plan of Care (Signed)

## 2019-12-01 DIAGNOSIS — E210 Primary hyperparathyroidism: Secondary | ICD-10-CM | POA: Diagnosis not present

## 2019-12-12 ENCOUNTER — Other Ambulatory Visit: Payer: Self-pay

## 2019-12-12 ENCOUNTER — Ambulatory Visit (INDEPENDENT_AMBULATORY_CARE_PROVIDER_SITE_OTHER): Payer: PPO | Admitting: Endocrinology

## 2019-12-12 ENCOUNTER — Encounter: Payer: Self-pay | Admitting: Endocrinology

## 2019-12-12 VITALS — BP 130/64 | HR 68 | Wt 153.2 lb

## 2019-12-12 DIAGNOSIS — E210 Primary hyperparathyroidism: Secondary | ICD-10-CM | POA: Diagnosis not present

## 2019-12-12 LAB — BASIC METABOLIC PANEL
BUN: 15 mg/dL (ref 6–23)
CO2: 31 mEq/L (ref 19–32)
Calcium: 12 mg/dL — ABNORMAL HIGH (ref 8.4–10.5)
Chloride: 106 mEq/L (ref 96–112)
Creatinine, Ser: 0.73 mg/dL (ref 0.40–1.20)
GFR: 86.51 mL/min (ref >60.00)
Glucose, Bld: 94 mg/dL (ref 70–99)
Potassium: 4.8 mEq/L (ref 3.5–5.1)
Sodium: 141 mEq/L (ref 135–145)

## 2019-12-12 LAB — VITAMIN D 25 HYDROXY (VIT D DEFICIENCY, FRACTURES): VITD: 29.41 ng/mL — ABNORMAL LOW (ref 30.00–100.00)

## 2019-12-12 NOTE — Patient Instructions (Addendum)
Blood tests are requested for you today.  We'll let you know about the results.   These blood tests can take time to readjust themselves after surgery.  Please come back for a follow-up appointment in 2 months.

## 2019-12-12 NOTE — Progress Notes (Signed)
Subjective:    Patient ID: Vicki George, female    DOB: February 14, 1955, 64 y.o.   MRN: [id number]  HPI Pt returns for f/u of hyperparthyroidism (hypercalcemia was dx'ed in 2019; PTH was high--it did not normalize with Vit-D).  She stopped Vit-D 1 month ago.  She had parathyroidect in 11/21.  Since the surgery, fatigue is less.   Past Medical History:  Diagnosis Date  . Chicken pox   . Depression   . Urinary tract infection     Past Surgical History:  Procedure Laterality Date  . MYOMECTOMY    . PARATHYROIDECTOMY Left 11/13/2019   Procedure: LEFT PARATHYROIDECTOMY;  Surgeon: Ralene Ok, MD;  Location: [facility] OR;  Service: General;  Laterality: Left;    Social History   Socioeconomic History  . Marital status: Widowed    Spouse name: Not on file  . Number of children: Not on file  . Years of education: Not on file  . Highest education level: Not on file  Occupational History  . Not on file  Tobacco Use  . Smoking status: Never Smoker  . Smokeless tobacco: Never Used  Vaping Use  . Vaping Use: Never used  Substance and Sexual Activity  . Alcohol use: No  . Drug use: No  . Sexual activity: Not Currently  Other Topics Concern  . Not on file  Social History Narrative  . Not on file   Social Determinants of Health   Financial Resource Strain: Not on file  Food Insecurity: Not on file  Transportation Needs: Not on file  Physical Activity: Not on file  Stress: Not on file  Social Connections: Not on file  Intimate Partner Violence: Not on file    Current Outpatient Medications on File Prior to Visit  Medication Sig Dispense Refill  . Ascorbic Acid (VITAMIN C) 500 MG CAPS Take 500 mg by mouth daily.     Marland Kitchen b complex vitamins capsule Take 1 capsule by mouth daily.    . calcium carbonate (CALCIUM 600) 600 MG TABS tablet Take 1 tablet (600 mg total) by mouth 2 (two) times daily with a meal. 60 tablet 1  . Cholecalciferol (HM VITAMIN D3) 100 MCG (4000 UT) CAPS Take 4,000  Units by mouth daily.    . magnesium oxide (MAGOX 400) 400 (241.3 Mg) MG tablet Take 1 tablet (400 mg total) by mouth daily. 60 tablet 1  . prednisoLONE acetate (PRED FORTE) 1 % ophthalmic suspension Place 1 drop into both eyes 4 (four) times daily.    . traMADol (ULTRAM) 50 MG tablet Take 1 tablet (50 mg total) by mouth every 6 (six) hours as needed (mild pain). 20 tablet 0   No current facility-administered medications on file prior to visit.    Allergies  Allergen Reactions  . Aspirin Other (See Comments)    Upset stomach    Family History  Problem Relation Age of Onset  . Hypercalcemia Neg Hx     BP 130/64 (BP Location: Left Arm, Patient Position: Sitting, Cuff Size: Normal)   Pulse 68   Wt 153 lb 3.2 oz (69.5 kg)   SpO2 98%   BMI 28.02 kg/m    Review of Systems Denies numbness and muscle cramps.      Objective:   Physical Exam VITAL SIGNS:  See vs page GENERAL: no distress NECK: Neck: a healing scar is present.  I do not appreciate a nodule in the thyroid or elsewhere in the neck.  Assessment & Plan:  Hyperparathyroidism: due for recheck.  Vit-D def: mild.  Resume at 1000 units/d   Patient Instructions  Blood tests are requested for you today.  We'll let you know about the results.   These blood tests can take time to readjust themselves after surgery.  Please come back for a follow-up appointment in 2 months.

## 2019-12-15 LAB — PTH, INTACT AND CALCIUM
Calcium: 11.8 mg/dL — ABNORMAL HIGH (ref 8.6–10.4)
PTH: 128 pg/mL — ABNORMAL HIGH (ref 14–64)

## 2020-01-14 DIAGNOSIS — H2512 Age-related nuclear cataract, left eye: Secondary | ICD-10-CM | POA: Diagnosis not present

## 2020-01-14 DIAGNOSIS — H25812 Combined forms of age-related cataract, left eye: Secondary | ICD-10-CM | POA: Diagnosis not present

## 2020-02-03 DIAGNOSIS — E892 Postprocedural hypoparathyroidism: Secondary | ICD-10-CM | POA: Diagnosis not present

## 2020-02-12 ENCOUNTER — Ambulatory Visit: Payer: PPO | Admitting: Endocrinology

## 2020-04-16 ENCOUNTER — Ambulatory Visit: Payer: 59 | Admitting: Family Medicine

## 2020-04-19 ENCOUNTER — Other Ambulatory Visit: Payer: Self-pay

## 2020-04-20 ENCOUNTER — Encounter: Payer: Self-pay | Admitting: Family Medicine

## 2020-04-20 ENCOUNTER — Ambulatory Visit (INDEPENDENT_AMBULATORY_CARE_PROVIDER_SITE_OTHER): Payer: PPO | Admitting: Family Medicine

## 2020-04-20 VITALS — BP 154/84 | HR 69 | Temp 97.4°F | Ht 62.0 in | Wt 156.8 lb

## 2020-04-20 DIAGNOSIS — E559 Vitamin D deficiency, unspecified: Secondary | ICD-10-CM

## 2020-04-20 DIAGNOSIS — Z0000 Encounter for general adult medical examination without abnormal findings: Secondary | ICD-10-CM

## 2020-04-20 DIAGNOSIS — E213 Hyperparathyroidism, unspecified: Secondary | ICD-10-CM | POA: Diagnosis not present

## 2020-04-20 DIAGNOSIS — E875 Hyperkalemia: Secondary | ICD-10-CM

## 2020-04-20 LAB — CBC
HCT: 42.4 % (ref 36.0–46.0)
Hemoglobin: 14.3 g/dL (ref 12.0–15.0)
MCHC: 33.7 g/dL (ref 30.0–36.0)
MCV: 88.7 fl (ref 78.0–100.0)
Platelets: 212 10*3/uL (ref 150.0–400.0)
RBC: 4.78 Mil/uL (ref 3.87–5.11)
RDW: 13.9 % (ref 11.5–15.5)
WBC: 4.1 10*3/uL (ref 4.0–10.5)

## 2020-04-20 LAB — URINALYSIS, ROUTINE W REFLEX MICROSCOPIC
Bilirubin Urine: NEGATIVE
Hgb urine dipstick: NEGATIVE
Ketones, ur: NEGATIVE
Leukocytes,Ua: NEGATIVE
Nitrite: NEGATIVE
RBC / HPF: NONE SEEN (ref 0–?)
Specific Gravity, Urine: 1.015 (ref 1.000–1.030)
Total Protein, Urine: NEGATIVE
Urine Glucose: NEGATIVE
Urobilinogen, UA: 0.2 (ref 0.0–1.0)
WBC, UA: NONE SEEN (ref 0–?)
pH: 7.5 (ref 5.0–8.0)

## 2020-04-20 LAB — COMPREHENSIVE METABOLIC PANEL
ALT: 16 U/L (ref 0–35)
AST: 19 U/L (ref 0–37)
Albumin: 4.1 g/dL (ref 3.5–5.2)
Alkaline Phosphatase: 149 U/L — ABNORMAL HIGH (ref 39–117)
BUN: 15 mg/dL (ref 6–23)
CO2: 30 mEq/L (ref 19–32)
Calcium: 11.4 mg/dL — ABNORMAL HIGH (ref 8.4–10.5)
Chloride: 105 mEq/L (ref 96–112)
Creatinine, Ser: 0.63 mg/dL (ref 0.40–1.20)
GFR: 93.08 mL/min (ref >60.00)
Glucose, Bld: 104 mg/dL — ABNORMAL HIGH (ref 70–99)
Potassium: 5.4 mEq/L — ABNORMAL HIGH (ref 3.5–5.1)
Sodium: 138 mEq/L (ref 135–145)
Total Bilirubin: 0.9 mg/dL (ref 0.2–1.2)
Total Protein: 7.1 g/dL (ref 6.0–8.3)

## 2020-04-20 LAB — LIPID PANEL
Cholesterol: 162 mg/dL (ref 0–200)
HDL: 41.5 mg/dL (ref >39.00)
LDL Cholesterol: 99 mg/dL (ref 0–99)
NonHDL: 120.09
Total CHOL/HDL Ratio: 4
Triglycerides: 105 mg/dL (ref 0.0–149.0)
VLDL: 21 mg/dL (ref 0.0–40.0)

## 2020-04-20 LAB — VITAMIN D 25 HYDROXY (VIT D DEFICIENCY, FRACTURES): VITD: 27.11 ng/mL — ABNORMAL LOW (ref 30.00–100.00)

## 2020-04-20 NOTE — Progress Notes (Signed)
Established Patient Office Visit  Subjective:  Patient ID: Vicki George, female    DOB: 1955/10/09  Age: 65 y.o. MRN: [id number]  CC:  Chief Complaint  Patient presents with  . Follow-up    6 month follow up on labs, no concerns. Patient fasting.     HPI Vicki George presents for follow-up for routine health check and recheck of hyperparathyroidism.  She is status post left parathyroid ectomy this past November.  Unfortunately her calcium and PTH are still elevated as of blood drawn in December.  She has been referred back to her surgeon for reevaluation.  Denies muscle aches and pains, arthralgias, sadness or depression at this time.  She is nervous about the situation.  She is no longer taking any calcium or vitamin D.  Past Medical History:  Diagnosis Date  . Chicken pox   . Depression   . Urinary tract infection     Past Surgical History:  Procedure Laterality Date  . MYOMECTOMY    . PARATHYROIDECTOMY Left 11/13/2019   Procedure: LEFT PARATHYROIDECTOMY;  Surgeon: Ralene Ok, MD;  Location: [facility] OR;  Service: General;  Laterality: Left;    Family History  Problem Relation Age of Onset  . Hypercalcemia Neg Hx     Social History   Socioeconomic History  . Marital status: Widowed    Spouse name: Not on file  . Number of children: Not on file  . Years of education: Not on file  . Highest education level: Not on file  Occupational History  . Not on file  Tobacco Use  . Smoking status: Never Smoker  . Smokeless tobacco: Never Used  Vaping Use  . Vaping Use: Never used  Substance and Sexual Activity  . Alcohol use: No  . Drug use: No  . Sexual activity: Not Currently  Other Topics Concern  . Not on file  Social History Narrative  . Not on file   Social Determinants of Health   Financial Resource Strain: Not on file  Food Insecurity: Not on file  Transportation Needs: Not on file  Physical Activity: Not on file  Stress: Not on file  Social Connections:  Not on file  Intimate Partner Violence: Not on file    Outpatient Medications Prior to Visit  Medication Sig Dispense Refill  . Ascorbic Acid (VITAMIN C) 500 MG CAPS Take 500 mg by mouth daily.  (Patient not taking: Reported on 04/20/2020)    . b complex vitamins capsule Take 1 capsule by mouth daily. (Patient not taking: Reported on 04/20/2020)    . calcium carbonate (CALCIUM 600) 600 MG TABS tablet Take 1 tablet (600 mg total) by mouth 2 (two) times daily with a meal. (Patient not taking: Reported on 04/20/2020) 60 tablet 1  . Cholecalciferol (HM VITAMIN D3) 100 MCG (4000 UT) CAPS Take 4,000 Units by mouth daily. (Patient not taking: Reported on 04/20/2020)    . magnesium oxide (MAGOX 400) 400 (241.3 Mg) MG tablet Take 1 tablet (400 mg total) by mouth daily. (Patient not taking: Reported on 04/20/2020) 60 tablet 1  . prednisoLONE acetate (PRED FORTE) 1 % ophthalmic suspension Place 1 drop into both eyes 4 (four) times daily. (Patient not taking: Reported on 04/20/2020)    . traMADol (ULTRAM) 50 MG tablet Take 1 tablet (50 mg total) by mouth every 6 (six) hours as needed (mild pain). (Patient not taking: Reported on 04/20/2020) 20 tablet 0   No facility-administered medications prior to visit.    Allergies  Allergen Reactions  . Aspirin Other (See Comments)    Upset stomach    ROS Review of Systems  Constitutional: Negative.   HENT: Negative.   Eyes: Negative for photophobia and visual disturbance.  Respiratory: Negative.   Cardiovascular: Negative.   Gastrointestinal: Negative.   Endocrine: Negative for polyphagia and polyuria.  Genitourinary: Negative.   Musculoskeletal: Negative for arthralgias and myalgias.  Neurological: Negative for speech difficulty and weakness.  Psychiatric/Behavioral: Negative.       Objective:    Physical Exam Vitals and nursing note reviewed.  Constitutional:      General: She is not in acute distress.    Appearance: Normal appearance. She is not  ill-appearing, toxic-appearing or diaphoretic.  HENT:     Head: Normocephalic and atraumatic.     Right Ear: Tympanic membrane, ear canal and external ear normal.     Left Ear: Tympanic membrane, ear canal and external ear normal.     Mouth/Throat:     Mouth: Mucous membranes are moist.     Pharynx: Oropharynx is clear. No oropharyngeal exudate or posterior oropharyngeal erythema.  Eyes:     General: No scleral icterus.       Right eye: No discharge.        Left eye: No discharge.     Extraocular Movements: Extraocular movements intact.     Conjunctiva/sclera: Conjunctivae normal.     Pupils: Pupils are equal, round, and reactive to light.  Cardiovascular:     Rate and Rhythm: Normal rate and regular rhythm.  Pulmonary:     Effort: Pulmonary effort is normal.     Breath sounds: Normal breath sounds.  Musculoskeletal:     Cervical back: No rigidity or tenderness.  Lymphadenopathy:     Cervical: No cervical adenopathy.  Skin:    General: Skin is warm and dry.  Neurological:     Mental Status: She is alert and oriented to person, place, and time.  Psychiatric:        Mood and Affect: Mood normal.        Behavior: Behavior normal.     BP (!) 154/84   Pulse 69   Temp (!) 97.4 F (36.3 C) (Temporal)   Ht 5\' 2"  (1.575 m)   Wt 156 lb 12.8 oz (71.1 kg)   SpO2 97%   BMI 28.68 kg/m  Wt Readings from Last 3 Encounters:  04/20/20 156 lb 12.8 oz (71.1 kg)  12/12/19 153 lb 3.2 oz (69.5 kg)  11/13/19 152 lb 5.4 oz (69.1 kg)     Health Maintenance Due  Topic Date Due  . TETANUS/TDAP  Never done  . PNA vac Low Risk Adult (1 of 2 - PCV13) Never done    There are no preventive care reminders to display for this patient.  Lab Results  Component Value Date   TSH 2.07 12/13/2016   Lab Results  Component Value Date   WBC 4.6 11/10/2019   HGB 13.5 11/10/2019   HCT 41.7 11/10/2019   MCV 91.6 11/10/2019   PLT 232 11/10/2019   Lab Results  Component Value Date   NA 141  12/12/2019   K 4.8 12/12/2019   CO2 31 12/12/2019   GLUCOSE 94 12/12/2019   BUN 15 12/12/2019   CREATININE 0.73 12/12/2019   BILITOT 1.0 07/09/2019   ALKPHOS 120 (H) 07/09/2019   AST 20 07/09/2019   ALT 17 07/09/2019   PROT 6.7 07/09/2019   ALBUMIN 4.1 07/09/2019   CALCIUM 11.8 (H) 12/12/2019  CALCIUM 12.0 (H) 12/12/2019   ANIONGAP 5 11/14/2019   GFR 86.51 12/12/2019   Lab Results  Component Value Date   CHOL 159 10/16/2019   Lab Results  Component Value Date   HDL 42.50 10/16/2019   Lab Results  Component Value Date   LDLCALC 95 10/16/2019   Lab Results  Component Value Date   TRIG 107.0 10/16/2019   Lab Results  Component Value Date   CHOLHDL 4 10/16/2019   No results found for: HGBA1C    Assessment & Plan:   Problem List Items Addressed This Visit      Other   Healthcare maintenance - Primary   Relevant Orders   CBC   Comprehensive metabolic panel   Lipid panel   Urinalysis, Routine w reflex microscopic   Vitamin D deficiency   Relevant Orders   VITAMIN D 25 Hydroxy (Vit-D Deficiency, Fractures)   Hypercalcemia   Relevant Orders   Comprehensive metabolic panel   PTH, intact (no Ca)    Other Visit Diagnoses    Hyperparathyroidism, unspecified ([place])       Relevant Orders   Comprehensive metabolic panel   PTH, intact (no Ca)      No orders of the defined types were placed in this encounter.   Follow-up: Return in about 6 months (around 10/20/2020), or if symptoms worsen or fail to improve.  She will be following up with Dr. Rosendo Gros next month.  Calcium and PTH levels drawn today.  Chart review shows blood pressure mostly running in the 130s over 80s.  Libby Maw, MD

## 2020-04-22 DIAGNOSIS — E875 Hyperkalemia: Secondary | ICD-10-CM | POA: Insufficient documentation

## 2020-04-22 LAB — PARATHYROID HORMONE, INTACT (NO CA): PTH: 171 pg/mL — ABNORMAL HIGH (ref 16–77)

## 2020-04-22 NOTE — Addendum Note (Signed)
Addended by: Jon Billings on: 04/22/2020 08:05 AM   Modules accepted: Orders

## 2020-04-23 ENCOUNTER — Other Ambulatory Visit: Payer: Self-pay

## 2020-04-23 ENCOUNTER — Other Ambulatory Visit (INDEPENDENT_AMBULATORY_CARE_PROVIDER_SITE_OTHER): Payer: PPO

## 2020-04-23 DIAGNOSIS — E875 Hyperkalemia: Secondary | ICD-10-CM | POA: Diagnosis not present

## 2020-04-23 LAB — POTASSIUM: Potassium: 4.9 mEq/L (ref 3.5–5.1)

## 2020-04-23 NOTE — Progress Notes (Signed)
Per orders of Dr. Ethelene Hal pt is here for lab draw pt tolerated draw well.

## 2020-09-17 DIAGNOSIS — Z961 Presence of intraocular lens: Secondary | ICD-10-CM | POA: Diagnosis not present

## 2020-09-17 DIAGNOSIS — H04123 Dry eye syndrome of bilateral lacrimal glands: Secondary | ICD-10-CM | POA: Diagnosis not present

## 2020-10-02 HISTORY — PX: EYE SURGERY: SHX253

## 2020-10-18 ENCOUNTER — Other Ambulatory Visit (HOSPITAL_BASED_OUTPATIENT_CLINIC_OR_DEPARTMENT_OTHER): Payer: Self-pay | Admitting: Family Medicine

## 2020-10-18 DIAGNOSIS — Z1231 Encounter for screening mammogram for malignant neoplasm of breast: Secondary | ICD-10-CM

## 2020-10-20 ENCOUNTER — Ambulatory Visit (INDEPENDENT_AMBULATORY_CARE_PROVIDER_SITE_OTHER): Payer: PPO | Admitting: Family Medicine

## 2020-10-20 ENCOUNTER — Other Ambulatory Visit: Payer: Self-pay

## 2020-10-20 ENCOUNTER — Encounter: Payer: Self-pay | Admitting: Family Medicine

## 2020-10-20 VITALS — BP 138/78 | HR 76 | Temp 97.1°F | Ht 62.0 in | Wt 155.2 lb

## 2020-10-20 DIAGNOSIS — M810 Age-related osteoporosis without current pathological fracture: Secondary | ICD-10-CM | POA: Diagnosis not present

## 2020-10-20 DIAGNOSIS — G4709 Other insomnia: Secondary | ICD-10-CM

## 2020-10-20 DIAGNOSIS — E213 Hyperparathyroidism, unspecified: Secondary | ICD-10-CM | POA: Diagnosis not present

## 2020-10-20 DIAGNOSIS — F439 Reaction to severe stress, unspecified: Secondary | ICD-10-CM | POA: Insufficient documentation

## 2020-10-20 MED ORDER — ESZOPICLONE 1 MG PO TABS: 1.0000 mg | ORAL_TABLET | Freq: Every evening | ORAL | 2 refills | Status: DC | PRN

## 2020-10-20 NOTE — Progress Notes (Addendum)
Established Patient Office Visit  Subjective:  Patient ID: Vicki George, female    DOB: [birth year]  Age: 65 y.o. MRN: [id number]  CC:  Chief Complaint  Patient presents with   Follow-up    6 month follow up    HPI Vicki George presents for follow-up of hypercalcemia associated with hyperparathyroidism.  Surgeon has advised her that repeat surgery will have to wait until next year.  At this time she is been avoiding all calcium supplements, vitamin D and calcium rich foods.  She is stressed at home.  Her son is a Forensic psychologist and has failed to launch.  He is not working regularly.  There is no substance abuse.  She has been dealing with a situation with tough love.  She denies depression but has not been sleeping well and is stressed.  Has not tolerated trazodone and melatonin has not been helpful.  Past Medical History:  Diagnosis Date   Chicken pox    Depression    Urinary tract infection     Past Surgical History:  Procedure Laterality Date   MYOMECTOMY     PARATHYROIDECTOMY Left 11/13/2019   Procedure: LEFT PARATHYROIDECTOMY;  Surgeon: Ralene Ok, MD;  Location: [facility] OR;  Service: General;  Laterality: Left;    Family History  Problem Relation Age of Onset   Hypercalcemia Neg Hx     Social History   Socioeconomic History   Marital status: Widowed    Spouse name: Not on file   Number of children: Not on file   Years of education: Not on file   Highest education level: Not on file  Occupational History   Not on file  Tobacco Use   Smoking status: Never   Smokeless tobacco: Never  Vaping Use   Vaping Use: Never used  Substance and Sexual Activity   Alcohol use: No   Drug use: No   Sexual activity: Not Currently  Other Topics Concern   Not on file  Social History Narrative   Not on file   Social Determinants of Health   Financial Resource Strain: Not on file  Food Insecurity: Not on file  Transportation Needs: Not on file  Physical Activity: Not on  file  Stress: Not on file  Social Connections: Not on file  Intimate Partner Violence: Not on file    No outpatient medications prior to visit.   No facility-administered medications prior to visit.    Allergies  Allergen Reactions   Aspirin Other (See Comments)    Upset stomach    ROS Review of Systems  Constitutional: Negative.   HENT: Negative.    Eyes:  Negative for photophobia and visual disturbance.  Respiratory: Negative.    Cardiovascular: Negative.   Gastrointestinal: Negative.   Endocrine: Negative for polyphagia and polyuria.  Genitourinary: Negative.   Musculoskeletal:  Negative for arthralgias and myalgias.  Neurological:  Negative for speech difficulty, weakness and headaches.  Psychiatric/Behavioral:  Positive for sleep disturbance. Negative for dysphoric mood. The patient is not nervous/anxious.      Objective:    Physical Exam Vitals and nursing note reviewed.  Constitutional:      General: She is not in acute distress.    Appearance: Normal appearance. She is not ill-appearing, toxic-appearing or diaphoretic.  HENT:     Head: Normocephalic and atraumatic.     Right Ear: External ear normal.     Left Ear: External ear normal.     Mouth/Throat:     Mouth: Mucous membranes  are moist.     Pharynx: Oropharynx is clear. No oropharyngeal exudate or posterior oropharyngeal erythema.  Eyes:     General: No scleral icterus.       Right eye: No discharge.        Left eye: No discharge.     Extraocular Movements: Extraocular movements intact.     Conjunctiva/sclera: Conjunctivae normal.     Pupils: Pupils are equal, round, and reactive to light.  Cardiovascular:     Rate and Rhythm: Normal rate and regular rhythm.  Pulmonary:     Effort: Pulmonary effort is normal.     Breath sounds: Normal breath sounds.  Abdominal:     General: Bowel sounds are normal.  Musculoskeletal:     Cervical back: No rigidity or tenderness.     Right lower leg: No edema.      Left lower leg: No edema.  Lymphadenopathy:     Cervical: No cervical adenopathy.  Skin:    General: Skin is warm and dry.  Neurological:     Mental Status: She is alert and oriented to person, place, and time.  Psychiatric:        Mood and Affect: Mood normal.        Behavior: Behavior normal.    BP 138/78 (BP Location: Left Arm, Patient Position: Sitting, Cuff Size: Normal)   Pulse 76   Temp (!) 97.1 F (36.2 C) (Temporal)   Ht 5\' 2"  (1.575 m)   Wt 155 lb 3.2 oz (70.4 kg)   SpO2 100%   BMI 28.39 kg/m  Wt Readings from Last 3 Encounters:  10/20/20 155 lb 3.2 oz (70.4 kg)  04/20/20 156 lb 12.8 oz (71.1 kg)  12/12/19 153 lb 3.2 oz (69.5 kg)     Health Maintenance Due  Topic Date Due   TETANUS/TDAP  Never done   Zoster Vaccines- Shingrix (1 of 2) Never done   Pneumonia Vaccine 52+ Years old (1 - PCV) Never done    There are no preventive care reminders to display for this patient.  Lab Results  Component Value Date   TSH 2.07 12/13/2016   Lab Results  Component Value Date   WBC 4.1 04/20/2020   HGB 14.3 04/20/2020   HCT 42.4 04/20/2020   MCV 88.7 04/20/2020   PLT 212.0 04/20/2020   Lab Results  Component Value Date   NA 138 04/20/2020   K 4.9 04/23/2020   CO2 30 04/20/2020   GLUCOSE 104 (H) 04/20/2020   BUN 15 04/20/2020   CREATININE 0.63 04/20/2020   BILITOT 0.9 04/20/2020   ALKPHOS 149 (H) 04/20/2020   AST 19 04/20/2020   ALT 16 04/20/2020   PROT 7.1 04/20/2020   ALBUMIN 4.1 04/20/2020   CALCIUM 12.1 (H) 10/20/2020   ANIONGAP 5 11/14/2019   GFR 93.08 04/20/2020   Lab Results  Component Value Date   CHOL 162 04/20/2020   Lab Results  Component Value Date   HDL 41.50 04/20/2020   Lab Results  Component Value Date   LDLCALC 99 04/20/2020   Lab Results  Component Value Date   TRIG 105.0 04/20/2020   Lab Results  Component Value Date   CHOLHDL 4 04/20/2020   No results found for: HGBA1C    Assessment & Plan:   Problem List  Items Addressed This Visit       Endocrine   Hyperparathyroidism, unspecified ([place]) - Primary   Relevant Orders   PTH, Intact and Calcium (Completed)  Other   Other insomnia   Relevant Medications   eszopiclone (LUNESTA) 1 MG TABS tablet   Hypercalcemia   Relevant Medications   alendronate (FOSAMAX) 70 MG tablet   Other Relevant Orders   PTH, Intact and Calcium (Completed)   Stress at home   Other Visit Diagnoses     Osteoporosis without current pathological fracture, unspecified osteoporosis type       Relevant Medications   alendronate (FOSAMAX) 70 MG tablet       Meds ordered this encounter  Medications   eszopiclone (LUNESTA) 1 MG TABS tablet    Sig: Take 1 tablet (1 mg total) by mouth at bedtime as needed for sleep. Take immediately before bedtime    Dispense:  30 tablet    Refill:  2   alendronate (FOSAMAX) 70 MG tablet    Sig: Take 1 tablet (70 mg total) by mouth every 7 (seven) days. Take with a full glass of water on an empty stomach.    Dispense:  4 tablet    Refill:  11     Follow-up: Return in about 6 months (around 04/20/2021).  Will try Lunesta for insomnia.  She was given information on this medication.  Also given information on hyperparathyroidism and hypercalcemia.  She is avoiding calcium rich foods at this point.  Also given information on insomnia itself and mindfulness based stress reduction.  Encouraged her to remain on her course of tough love.   Libby Maw, MD

## 2020-10-21 LAB — PTH, INTACT AND CALCIUM
Calcium: 12.1 mg/dL — ABNORMAL HIGH (ref 8.6–10.4)
PTH: 112 pg/mL — ABNORMAL HIGH (ref 16–77)

## 2020-10-22 MED ORDER — ALENDRONATE SODIUM 70 MG PO TABS: 70.0000 mg | ORAL_TABLET | ORAL | 11 refills | Status: DC

## 2020-10-22 NOTE — Addendum Note (Signed)
Addended by: Abelino Derrick A on: 10/22/2020 01:00 PM   Modules accepted: Orders

## 2020-10-26 ENCOUNTER — Ambulatory Visit (INDEPENDENT_AMBULATORY_CARE_PROVIDER_SITE_OTHER): Payer: PPO | Admitting: Family Medicine

## 2020-10-26 ENCOUNTER — Encounter: Payer: Self-pay | Admitting: Family Medicine

## 2020-10-26 ENCOUNTER — Other Ambulatory Visit: Payer: Self-pay

## 2020-10-26 VITALS — BP 142/78 | HR 78 | Temp 97.6°F | Ht 62.0 in | Wt 155.2 lb

## 2020-10-26 DIAGNOSIS — M810 Age-related osteoporosis without current pathological fracture: Secondary | ICD-10-CM

## 2020-10-26 DIAGNOSIS — G4709 Other insomnia: Secondary | ICD-10-CM

## 2020-10-26 DIAGNOSIS — E213 Hyperparathyroidism, unspecified: Secondary | ICD-10-CM | POA: Diagnosis not present

## 2020-10-26 NOTE — Progress Notes (Signed)
Established Patient Office Visit  Subjective:  Patient ID: Vicki George, female    DOB: December 21, 1955  Age: 65 y.o. MRN: [id number]  CC:  Chief Complaint  Patient presents with   Advice Only    Discuss labs/elevated calcium levels.     HPI Vicki George presents for follow-up of hyperparathyroidism with hypercalcemia and severe osteoporosis.  She is here to discuss treatment plans.  1 year follow-up with surgery will be at the end of November.  Patient has started the Fosamax that we had sent in to her pharmacy without issue.  She is not supplementing with vitamin D or calcium.  Lunesta helps some.  She is willing to keep trying it for now.  Past Medical History:  Diagnosis Date   Chicken pox    Depression    Urinary tract infection     Past Surgical History:  Procedure Laterality Date   MYOMECTOMY     PARATHYROIDECTOMY Left 11/13/2019   Procedure: LEFT PARATHYROIDECTOMY;  Surgeon: Ralene Ok, MD;  Location: [facility] OR;  Service: General;  Laterality: Left;    Family History  Problem Relation Age of Onset   Hypercalcemia Neg Hx     Social History   Socioeconomic History   Marital status: Widowed    Spouse name: Not on file   Number of children: Not on file   Years of education: Not on file   Highest education level: Not on file  Occupational History   Not on file  Tobacco Use   Smoking status: Never   Smokeless tobacco: Never  Vaping Use   Vaping Use: Never used  Substance and Sexual Activity   Alcohol use: No   Drug use: No   Sexual activity: Not Currently  Other Topics Concern   Not on file  Social History Narrative   Not on file   Social Determinants of Health   Financial Resource Strain: Not on file  Food Insecurity: Not on file  Transportation Needs: Not on file  Physical Activity: Not on file  Stress: Not on file  Social Connections: Not on file  Intimate Partner Violence: Not on file    Outpatient Medications Prior to Visit  Medication Sig  Dispense Refill   alendronate (FOSAMAX) 70 MG tablet Take 1 tablet (70 mg total) by mouth every 7 (seven) days. Take with a full glass of water on an empty stomach. 4 tablet 11   eszopiclone (LUNESTA) 1 MG TABS tablet Take 1 tablet (1 mg total) by mouth at bedtime as needed for sleep. Take immediately before bedtime 30 tablet 2   No facility-administered medications prior to visit.    Allergies  Allergen Reactions   Aspirin Other (See Comments)    Upset stomach    ROS Review of Systems  Constitutional: Negative.   Respiratory: Negative.    Cardiovascular: Negative.   Gastrointestinal: Negative.   Musculoskeletal:  Negative for arthralgias, gait problem and joint swelling.  Psychiatric/Behavioral:  Positive for sleep disturbance.      Objective:    Physical Exam Vitals and nursing note reviewed.  Constitutional:      General: She is not in acute distress.    Appearance: Normal appearance. She is not ill-appearing, toxic-appearing or diaphoretic.  HENT:     Head: Normocephalic and atraumatic.     Right Ear: External ear normal.     Left Ear: External ear normal.  Eyes:     General: No scleral icterus.       Right eye: No  discharge.        Left eye: No discharge.     Extraocular Movements: Extraocular movements intact.     Conjunctiva/sclera: Conjunctivae normal.  Pulmonary:     Effort: Pulmonary effort is normal.  Skin:    General: Skin is dry.  Neurological:     Mental Status: She is alert and oriented to person, place, and time.  Psychiatric:        Mood and Affect: Mood normal.        Behavior: Behavior normal.    BP (!) 142/78 (BP Location: Right Arm, Patient Position: Sitting, Cuff Size: Normal)   Pulse 78   Temp 97.6 F (36.4 C) (Temporal)   Ht 5\' 2"  (1.575 m)   Wt 155 lb 3.2 oz (70.4 kg)   SpO2 99%   BMI 28.39 kg/m  Wt Readings from Last 3 Encounters:  10/26/20 155 lb 3.2 oz (70.4 kg)  10/20/20 155 lb 3.2 oz (70.4 kg)  04/20/20 156 lb 12.8 oz (71.1  kg)     Health Maintenance Due  Topic Date Due   TETANUS/TDAP  Never done   Zoster Vaccines- Shingrix (1 of 2) Never done   Pneumonia Vaccine 35+ Years old (1 - PCV) Never done    There are no preventive care reminders to display for this patient.  Lab Results  Component Value Date   TSH 2.07 12/13/2016   Lab Results  Component Value Date   WBC 4.1 04/20/2020   HGB 14.3 04/20/2020   HCT 42.4 04/20/2020   MCV 88.7 04/20/2020   PLT 212.0 04/20/2020   Lab Results  Component Value Date   NA 138 04/20/2020   K 4.9 04/23/2020   CO2 30 04/20/2020   GLUCOSE 104 (H) 04/20/2020   BUN 15 04/20/2020   CREATININE 0.63 04/20/2020   BILITOT 0.9 04/20/2020   ALKPHOS 149 (H) 04/20/2020   AST 19 04/20/2020   ALT 16 04/20/2020   PROT 7.1 04/20/2020   ALBUMIN 4.1 04/20/2020   CALCIUM 12.1 (H) 10/20/2020   ANIONGAP 5 11/14/2019   GFR 93.08 04/20/2020   Lab Results  Component Value Date   CHOL 162 04/20/2020   Lab Results  Component Value Date   HDL 41.50 04/20/2020   Lab Results  Component Value Date   LDLCALC 99 04/20/2020   Lab Results  Component Value Date   TRIG 105.0 04/20/2020   Lab Results  Component Value Date   CHOLHDL 4 04/20/2020   No results found for: HGBA1C    Assessment & Plan:   Problem List Items Addressed This Visit       Endocrine   Hyperparathyroidism, unspecified ([place])     Other   Other insomnia   Hypercalcemia   Other Visit Diagnoses     Osteoporosis without current pathological fracture, unspecified osteoporosis type    -  Primary       No orders of the defined types were placed in this encounter.   Follow-up: Return in about 3 months (around 01/26/2021).  Will continue Fosamax weekly.  Will take in the morning time with a full glass of water and remain in an upright position for 4 hours after taking it.  Continue to hold calcium and vitamin D.  She will schedule follow-up with general surgery the end of November.  We will  continue with 1 mg of Lunesta.  We will increase as needed.  We discussed fall prevention.  Information was given on hyperparathyroidism, hypercalcemia and osteoporosis.  Libby Maw, MD

## 2020-11-22 ENCOUNTER — Encounter (HOSPITAL_BASED_OUTPATIENT_CLINIC_OR_DEPARTMENT_OTHER): Payer: Self-pay

## 2020-11-22 ENCOUNTER — Ambulatory Visit (HOSPITAL_BASED_OUTPATIENT_CLINIC_OR_DEPARTMENT_OTHER)
Admission: RE | Admit: 2020-11-22 | Discharge: 2020-11-22 | Disposition: A | Discharge status: Home / Self Care | Payer: PPO | Source: Ambulatory Visit | Attending: Family Medicine | Admitting: Family Medicine

## 2020-11-22 ENCOUNTER — Other Ambulatory Visit: Payer: Self-pay

## 2020-11-22 DIAGNOSIS — Z1231 Encounter for screening mammogram for malignant neoplasm of breast: Secondary | ICD-10-CM | POA: Insufficient documentation

## 2020-12-01 DIAGNOSIS — E213 Hyperparathyroidism, unspecified: Secondary | ICD-10-CM | POA: Diagnosis not present

## 2020-12-02 DIAGNOSIS — E213 Hyperparathyroidism, unspecified: Secondary | ICD-10-CM | POA: Diagnosis not present

## 2020-12-05 DIAGNOSIS — E213 Hyperparathyroidism, unspecified: Secondary | ICD-10-CM | POA: Diagnosis not present

## 2020-12-07 ENCOUNTER — Other Ambulatory Visit (HOSPITAL_COMMUNITY): Payer: Self-pay | Admitting: General Surgery

## 2020-12-07 ENCOUNTER — Other Ambulatory Visit: Payer: Self-pay | Admitting: General Surgery

## 2020-12-07 DIAGNOSIS — E213 Hyperparathyroidism, unspecified: Secondary | ICD-10-CM

## 2020-12-08 ENCOUNTER — Telehealth: Payer: Self-pay | Admitting: Family Medicine

## 2020-12-08 NOTE — Telephone Encounter (Signed)
Left message for patient to call back at to schedule Medicare Annual Wellness Visit   Last AWV  11/02/20  Please schedule at anytime with [facility] if patient calls the office back.    67 Minutes appointment   Any questions, please call me at [phone]

## 2020-12-31 ENCOUNTER — Ambulatory Visit (HOSPITAL_COMMUNITY)
Admission: RE | Admit: 2020-12-31 | Discharge: 2020-12-31 | Disposition: A | Discharge status: Home / Self Care | Payer: PPO | Source: Ambulatory Visit | Attending: General Surgery | Admitting: General Surgery

## 2020-12-31 ENCOUNTER — Other Ambulatory Visit: Payer: Self-pay

## 2020-12-31 DIAGNOSIS — E213 Hyperparathyroidism, unspecified: Secondary | ICD-10-CM | POA: Insufficient documentation

## 2020-12-31 MED ORDER — TECHNETIUM TC 99M SESTAMIBI - CARDIOLITE
24.9000 | Freq: Once | INTRAVENOUS | Status: AC
  Administered 2020-12-31: 11:00:00 24.9 via INTRAVENOUS

## 2021-01-05 ENCOUNTER — Ambulatory Visit: Payer: Self-pay | Admitting: General Surgery

## 2021-01-05 DIAGNOSIS — E210 Primary hyperparathyroidism: Secondary | ICD-10-CM | POA: Diagnosis not present

## 2021-01-05 NOTE — H&P (Signed)
Chief Complaint: No chief complaint on file.       History of Present Illness: Vicki George is a 66 y.o. female who is seen today for primary hyperparathyroidism. Since patient last visit she underwent 24-hour urine calcium.  This did reveal a normal period for her urine calcium.  She also underwent a D and D3 labs.  The Vit D3 lab was elev. Pt also underwent localizing study.  This did show a R sup parathyroid location.  I went over these labs in detail with the patient.         Review of Systems: A complete review of systems was obtained from the patient.  I have reviewed this information and discussed as appropriate with the patient.  See HPI as well for other ROS.   Review of Systems  Constitutional: Negative.   HENT: Negative.   Eyes: Negative.   Respiratory: Negative.   Cardiovascular: Negative.   Gastrointestinal: Negative.   Genitourinary: Negative.   Musculoskeletal: Negative.   Skin: Negative.   Neurological: Negative.   Endo/Heme/Allergies: Negative.   Psychiatric/Behavioral: Negative.         Medical History: Past Medical History Past Medical History: Diagnosis Date  Thyroid disease        There is no problem list on file for this patient.     Past Surgical History Past Surgical History: Procedure Laterality Date  PARATHYROIDECTOMY          Allergies Allergies Allergen Reactions  Aspirin Nausea and Other (See Comments)     Upset stomach        Current Outpatient Medications on File Prior to Visit Medication Sig Dispense Refill  alendronate (FOSAMAX) 70 MG tablet TAKE 1 TABLET BY MOUTH ONCE A WEEK TAKE WITH A FULL GLASS OF WATER ON AN EMPTY STOMACH       No current facility-administered medications on file prior to visit.     Family History History reviewed. No pertinent family history.     Social History   Tobacco Use Smoking Status Never Smokeless Tobacco Never     Social History Social History    Socioeconomic  History  Marital status: Unknown Tobacco Use  Smoking status: Never  Smokeless tobacco: Never Vaping Use  Vaping Use: Never used Substance and Sexual Activity  Alcohol use: Never  Drug use: Never      Objective:     There were no vitals filed for this visit.  There is no height or weight on file to calculate BMI.   Physical Exam Constitutional:      Appearance: Normal appearance.  HENT:     Head: Normocephalic and atraumatic.     Mouth/Throat:     Mouth: Mucous membranes are moist.     Pharynx: Oropharynx is clear.  Eyes:     General: No scleral icterus.    Pupils: Pupils are equal, round, and reactive to light.  Cardiovascular:     Rate and Rhythm: Normal rate and regular rhythm.     Pulses: Normal pulses.     Heart sounds: No murmur heard.   No friction rub. No gallop.  Pulmonary:     Effort: Pulmonary effort is normal. No respiratory distress.     Breath sounds: Normal breath sounds. No stridor.  Abdominal:     General: Abdomen is flat.  Musculoskeletal:        General: No swelling.  Skin:    General: Skin is warm.  Neurological:     General: No focal deficit present.  Mental Status: She is alert and oriented to person, place, and time. Mental status is at baseline.  Psychiatric:        Mood and Affect: Mood normal.        Thought Content: Thought content normal.        Judgment: Judgment normal.                Assessment and Plan:    Diagnoses and all orders for this visit:   Primary hyperparathyroidism (CMS-HCC)       54 F with R superior parathyroid adenoma.   We will proceed to the OR for r parathryoidectomy. I d/w her the risks and benefits of the procedure to include but not limited to: infection, bleeding, possible need for more surgery.  PT voiced understanding and wishes to proceed.       No follow-ups on file. Ralene Ok, MD

## 2021-01-18 DIAGNOSIS — H04121 Dry eye syndrome of right lacrimal gland: Secondary | ICD-10-CM | POA: Diagnosis not present

## 2021-01-25 ENCOUNTER — Ambulatory Visit (INDEPENDENT_AMBULATORY_CARE_PROVIDER_SITE_OTHER): Payer: PPO

## 2021-01-25 DIAGNOSIS — Z0000 Encounter for general adult medical examination without abnormal findings: Secondary | ICD-10-CM | POA: Diagnosis not present

## 2021-01-25 NOTE — Progress Notes (Signed)
Subjective:   Vicki George is a 66 y.o. female who presents for Medicare Annual (Subsequent) preventive examination. I connected with [name] today by telephone and verified that I am speaking with the correct person using two identifiers. Location patient: home Location provider: work Persons participating in the virtual visit: patient, provider.   I discussed the limitations, risks, security and privacy concerns of performing an evaluation and management service by telephone and the availability of in person appointments. I also discussed with the patient that there may be a patient responsible charge related to this service. The patient expressed understanding and verbally consented to this telephonic visit.    Interactive audio and video telecommunications were attempted between this provider and patient, however failed, due to patient having technical difficulties OR patient did not have access to video capability.  We continued and completed visit with audio only.     Review of Systems     Cardiac Risk Factors include: advanced age (>60men, >28 women)     Objective:    Today's Vitals   There is no height or weight on file to calculate BMI.  Advanced Directives 01/25/2021 11/13/2019 11/10/2019  Does Patient Have a Medical Advance Directive? No No No  Would patient like information on creating a medical advance directive? No - Patient declined No - Patient declined -    Current Medications (verified) Outpatient Encounter Medications as of 01/25/2021  Medication Sig   alendronate (FOSAMAX) 70 MG tablet Take 1 tablet (70 mg total) by mouth every 7 (seven) days. Take with a full glass of water on an empty stomach.   eszopiclone (LUNESTA) 1 MG TABS tablet Take 1 tablet (1 mg total) by mouth at bedtime as needed for sleep. Take immediately before bedtime   No facility-administered encounter medications on file as of 01/25/2021.    Allergies (verified) Aspirin   History: Past  Medical History:  Diagnosis Date   Chicken pox    Depression    Urinary tract infection    Past Surgical History:  Procedure Laterality Date   MYOMECTOMY     PARATHYROIDECTOMY Left 11/13/2019   Procedure: LEFT PARATHYROIDECTOMY;  Surgeon: Ralene Ok, MD;  Location: [facility];  Service: General;  Laterality: Left;   Family History  Problem Relation Age of Onset   Hypercalcemia Neg Hx    Social History   Socioeconomic History   Marital status: Widowed    Spouse name: Not on file   Number of children: Not on file   Years of education: Not on file   Highest education level: Not on file  Occupational History   Not on file  Tobacco Use   Smoking status: Never   Smokeless tobacco: Never  Vaping Use   Vaping Use: Never used  Substance and Sexual Activity   Alcohol use: No   Drug use: No   Sexual activity: Not Currently  Other Topics Concern   Not on file  Social History Narrative   Not on file   Social Determinants of Health   Financial Resource Strain: Low Risk    Difficulty of Paying Living Expenses: Not hard at all  Food Insecurity: No Food Insecurity   Worried About Running Out of Food in the Last Year: Never true   [place] in the Last Year: Never true  Transportation Needs: No Transportation Needs   Lack of Transportation (Medical): No   Lack of Transportation (Non-Medical): No  Physical Activity: Insufficiently Active   Days of Exercise  per Week: 3 days   Minutes of Exercise per Session: 30 min  Stress: No Stress Concern Present   Feeling of Stress : Not at all  Social Connections: Moderately Isolated   Frequency of Communication with Friends and Family: Twice a week   Frequency of Social Gatherings with Friends and Family: Twice a week   Attends Religious Services: More than 4 times per year   Active Member of [employer] or Organizations: No   Attends Archivist Meetings: Never   Marital Status: Widowed    Tobacco Counseling Counseling  given: Not Answered   Clinical Intake:  Pre-visit preparation completed: Yes  Pain : No/denies pain     Nutritional Risks: None Diabetes: No  How often do you need to have someone help you when you read instructions, pamphlets, or other written materials from your doctor or pharmacy?: 1 - Never What is the last grade level you completed in school?: college  Diabetic?no   Interpreter Needed?: No  Information entered by :: [place] of Daily Living In your present state of health, do you have any difficulty performing the following activities: 01/25/2021  Hearing? N  Vision? N  Difficulty concentrating or making decisions? N  Walking or climbing stairs? N  Dressing or bathing? N  Doing errands, shopping? N  Preparing Food and eating ? N  Using the Toilet? N  In the past six months, have you accidently leaked urine? N  Do you have problems with loss of bowel control? N  Managing your Medications? N  Managing your Finances? N  Housekeeping or managing your Housekeeping? N  Some recent data might be hidden    Patient Care Team: Libby Maw, MD as PCP - General (Family Medicine)  Indicate any recent Medical Services you may have received from other than [facility] providers in the past year (date may be approximate).     Assessment:   This is a routine wellness examination for [facility].  Hearing/Vision screen Vision Screening - Comments:: Annual eye exams wear glasses   Dietary issues and exercise activities discussed: Current Exercise Habits: Home exercise routine, Type of exercise: walking, Time (Minutes): 20, Frequency (Times/Week): 2, Weekly Exercise (Minutes/Week): 40, Intensity: Mild, Exercise limited by: None identified   Goals Addressed   None    Depression Screen PHQ 2/9 Scores 01/25/2021 01/25/2021 10/20/2020 04/20/2020 04/16/2019 04/16/2019 09/07/2017  PHQ - 2 Score 0 0 0 0 0 0 0  PHQ- 9 Score - - - - 0 - 2    Fall Risk Fall Risk   01/25/2021 10/20/2020 04/20/2020 04/16/2019  Falls in the past year? 0 0 0 0  Number falls in past yr: 0 0 - -  Injury with Fall? 0 - - -  Risk for fall due to : No Fall Risks - - -  Follow up Falls evaluation completed - - -    FALL RISK PREVENTION PERTAINING TO THE HOME:  Any stairs in or around the home? Yes  If so, are there any without handrails? No  Home free of loose throw rugs in walkways, pet beds, electrical cords, etc? Yes  Adequate lighting in your home to reduce risk of falls? Yes   ASSISTIVE DEVICES UTILIZED TO PREVENT FALLS:  Life alert? No  Use of a cane, walker or w/c? No  Grab bars in the bathroom? No  Shower chair or bench in shower? No  Elevated toilet seat or a handicapped toilet? No    Cognitive Function:  Normal cognitive status assessed by direct observation by this Nurse Health Advisor. No abnormalities found.        Immunizations Immunization History  Administered Date(s) Administered   Influenza,inj,Quad PF,6+ Mos 12/13/2016   PFIZER(Purple Top)SARS-COV-2 Vaccination 03/31/2019, 04/23/2019, 01/09/2020    TDAP status: Due, Education has been provided regarding the importance of this vaccine. Advised may receive this vaccine at local pharmacy or Health Dept. Aware to provide a copy of the vaccination record if obtained from local pharmacy or Health Dept. Verbalized acceptance and understanding.  Flu Vaccine status: Declined, Education has been provided regarding the importance of this vaccine but patient still declined. Advised may receive this vaccine at local pharmacy or Health Dept. Aware to provide a copy of the vaccination record if obtained from local pharmacy or Health Dept. Verbalized acceptance and understanding.  Pneumococcal vaccine status: Due, Education has been provided regarding the importance of this vaccine. Advised may receive this vaccine at local pharmacy or Health Dept. Aware to provide a copy of the vaccination record if obtained  from local pharmacy or Health Dept. Verbalized acceptance and understanding.  Covid-19 vaccine status: Completed vaccines  Qualifies for Shingles Vaccine? Yes   Zostavax completed No   Shingrix Completed?: No.    Education has been provided regarding the importance of this vaccine. Patient has been advised to call insurance company to determine out of pocket expense if they have not yet received this vaccine. Advised may also receive vaccine at local pharmacy or Health Dept. Verbalized acceptance and understanding.  Screening Tests Health Maintenance  Topic Date Due   TETANUS/TDAP  Never done   Zoster Vaccines- Shingrix (1 of 2) Never done   Pneumonia Vaccine 75+ Years old (1 - PCV) Never done   Fecal DNA (Cologuard)  12/11/2020   MAMMOGRAM  11/22/2021   DEXA SCAN  Completed   Hepatitis C Screening  Completed   HPV VACCINES  Aged Out   INFLUENZA VACCINE  Discontinued   COVID-19 Vaccine  Discontinued    Health Maintenance  Health Maintenance Due  Topic Date Due   TETANUS/TDAP  Never done   Zoster Vaccines- Shingrix (1 of 2) Never done   Pneumonia Vaccine 55+ Years old (1 - PCV) Never done   Fecal DNA (Cologuard)  12/11/2020    Colorectal cancer screening: Referral to GI placed patient declines . Pt aware the office will call re: appt.  Mammogram status: Completed 11/22/2020. Repeat every year  Bone Density status: Completed 06/16/2019. Results reflect: Bone density results: OSTEOPOROSIS. Repeat every 2 years.  Lung Cancer Screening: (Low Dose CT Chest recommended if Age 37-80 years, 30 pack-year currently smoking OR have quit w/in 15years.) does not qualify.   Lung Cancer Screening Referral: n/a  Additional Screening:  Hepatitis C Screening: does not qualify; Completed 12/13/2016  Vision Screening: Recommended annual ophthalmology exams for early detection of glaucoma and other disorders of the eye. Is the patient up to date with their annual eye exam?  Yes  Who is the  provider or what is the name of the office in which the patient attends annual eye exams? Dr.Fulp  If pt is not established with a provider, would they like to be referred to a provider to establish care? No .   Dental Screening: Recommended annual dental exams for proper oral hygiene  Community Resource Referral / Chronic Care Management: CRR required this visit?  No   CCM required this visit?  No      Plan:  I have personally reviewed and noted the following in the patients chart:   Medical and social history Use of alcohol, tobacco or illicit drugs  Current medications and supplements including opioid prescriptions.  Functional ability and status Nutritional status Physical activity Advanced directives List of other physicians Hospitalizations, surgeries, and ER visits in previous 12 months Vitals Screenings to include cognitive, depression, and falls Referrals and appointments  In addition, I have reviewed and discussed with patient certain preventive protocols, quality metrics, and best practice recommendations. A written personalized care plan for preventive services as well as general preventive health recommendations were provided to patient.     Randel Pigg, LPN   [id number]   Nurse Notes: none

## 2021-01-25 NOTE — Patient Instructions (Addendum)
Vicki George , Thank you for taking time to come for your Medicare Wellness Visit. I appreciate your ongoing commitment to your health goals. Please review the following plan we discussed and let me know if I can assist you in the future.   Screening recommendations/referrals: Colonoscopy: due patient declines at this time will discuss 01/26/2021 with Dr. Ethelene Hal Mammogram: 11/22/2020 Bone Density: 06/16/2019 Recommended yearly ophthalmology/optometry visit for glaucoma screening and checkup Recommended yearly dental visit for hygiene and checkup  Vaccinations: Influenza vaccine: declines  Pneumococcal vaccine: due  Tdap vaccine: due  Shingles vaccine: declines      Advanced directives: none   Conditions/risks identified: none   Next appointment: Dr. Ethelene Hal 01/27/2020 0900am   Preventive Care 68 Years and Older, Female Preventive care refers to lifestyle choices and visits with your health care provider that can promote health and wellness. What does preventive care include? A yearly physical exam. This is also called an annual well check. Dental exams once or twice a year. Routine eye exams. Ask your health care provider how often you should have your eyes checked. Personal lifestyle choices, including: Daily care of your teeth and gums. Regular physical activity. Eating a healthy diet. Avoiding tobacco and drug use. Limiting alcohol use. Practicing safe sex. Taking low-dose aspirin every day. Taking vitamin and mineral supplements as recommended by your health care provider. What happens during an annual well check? The services and screenings done by your health care provider during your annual well check will depend on your age, overall health, lifestyle risk factors, and family history of disease. Counseling  Your health care provider may ask you questions about your: Alcohol use. Tobacco use. Drug use. Emotional well-being. Home and relationship well-being. Sexual  activity. Eating habits. History of falls. Memory and ability to understand (cognition). Work and work Statistician. Reproductive health. Screening  You may have the following tests or measurements: Height, weight, and BMI. Blood pressure. Lipid and cholesterol levels. These may be checked every 5 years, or more frequently if you are over 105 years old. Skin check. Lung cancer screening. You may have this screening every year starting at age 73 if you have a 30-pack-year history of smoking and currently smoke or have quit within the past 15 years. Fecal occult blood test (FOBT) of the stool. You may have this test every year starting at age 6. Flexible sigmoidoscopy or colonoscopy. You may have a sigmoidoscopy every 5 years or a colonoscopy every 10 years starting at age 86. Hepatitis C blood test. Hepatitis B blood test. Sexually transmitted disease (STD) testing. Diabetes screening. This is done by checking your blood sugar (glucose) after you have not eaten for a while (fasting). You may have this done every 1-3 years. Bone density scan. This is done to screen for osteoporosis. You may have this done starting at age 37. Mammogram. This may be done every 1-2 years. Talk to your health care provider about how often you should have regular mammograms. Talk with your health care provider about your test results, treatment options, and if necessary, the need for more tests. Vaccines  Your health care provider may recommend certain vaccines, such as: Influenza vaccine. This is recommended every year. Tetanus, diphtheria, and acellular pertussis (Tdap, Td) vaccine. You may need a Td booster every 10 years. Zoster vaccine. You may need this after age 26. Pneumococcal 13-valent conjugate (PCV13) vaccine. One dose is recommended after age 66. Pneumococcal polysaccharide (PPSV23) vaccine. One dose is recommended after age 37. Talk to  your health care provider about which screenings and vaccines  you need and how often you need them. This information is not intended to replace advice given to you by your health care provider. Make sure you discuss any questions you have with your health care provider. Document Released: 01/15/2015 Document Revised: 09/08/2015 Document Reviewed: 10/20/2014 Elsevier Interactive Patient Education  2017 [place] Prevention in the Home Falls can cause injuries. They can happen to people of all ages. There are many things you can do to make your home safe and to help prevent falls. What can I do on the outside of my home? Regularly fix the edges of walkways and driveways and fix any cracks. Remove anything that might make you trip as you walk through a door, such as a raised step or threshold. Trim any bushes or trees on the path to your home. Use bright outdoor lighting. Clear any walking paths of anything that might make someone trip, such as rocks or tools. Regularly check to see if handrails are loose or broken. Make sure that both sides of any steps have handrails. Any raised decks and porches should have guardrails on the edges. Have any leaves, snow, or ice cleared regularly. Use sand or salt on walking paths during winter. Clean up any spills in your garage right away. This includes oil or grease spills. What can I do in the bathroom? Use night lights. Install grab bars by the toilet and in the tub and shower. Do not use towel bars as grab bars. Use non-skid mats or decals in the tub or shower. If you need to sit down in the shower, use a plastic, non-slip stool. Keep the floor dry. Clean up any water that spills on the floor as soon as it happens. Remove soap buildup in the tub or shower regularly. Attach bath mats securely with double-sided non-slip rug tape. Do not have throw rugs and other things on the floor that can make you trip. What can I do in the bedroom? Use night lights. Make sure that you have a light by your bed that  is easy to reach. Do not use any sheets or blankets that are too big for your bed. They should not hang down onto the floor. Have a firm chair that has side arms. You can use this for support while you get dressed. Do not have throw rugs and other things on the floor that can make you trip. What can I do in the kitchen? Clean up any spills right away. Avoid walking on wet floors. Keep items that you use a lot in easy-to-reach places. If you need to reach something above you, use a strong step stool that has a grab bar. Keep electrical cords out of the way. Do not use floor polish or wax that makes floors slippery. If you must use wax, use non-skid floor wax. Do not have throw rugs and other things on the floor that can make you trip. What can I do with my stairs? Do not leave any items on the stairs. Make sure that there are handrails on both sides of the stairs and use them. Fix handrails that are broken or loose. Make sure that handrails are as long as the stairways. Check any carpeting to make sure that it is firmly attached to the stairs. Fix any carpet that is loose or worn. Avoid having throw rugs at the top or bottom of the stairs. If you do have throw rugs, attach  them to the floor with carpet tape. Make sure that you have a light switch at the top of the stairs and the bottom of the stairs. If you do not have them, ask someone to add them for you. What else can I do to help prevent falls? Wear shoes that: Do not have high heels. Have rubber bottoms. Are comfortable and fit you well. Are closed at the toe. Do not wear sandals. If you use a stepladder: Make sure that it is fully opened. Do not climb a closed stepladder. Make sure that both sides of the stepladder are locked into place. Ask someone to hold it for you, if possible. Clearly mark and make sure that you can see: Any grab bars or handrails. First and last steps. Where the edge of each step is. Use tools that help you  move around (mobility aids) if they are needed. These include: Canes. Walkers. Scooters. Crutches. Turn on the lights when you go into a dark area. Replace any light bulbs as soon as they burn out. Set up your furniture so you have a clear path. Avoid moving your furniture around. If any of your floors are uneven, fix them. If there are any pets around you, be aware of where they are. Review your medicines with your doctor. Some medicines can make you feel dizzy. This can increase your chance of falling. Ask your doctor what other things that you can do to help prevent falls. This information is not intended to replace advice given to you by your health care provider. Make sure you discuss any questions you have with your health care provider. Document Released: 10/15/2008 Document Revised: 05/27/2015 Document Reviewed: 01/23/2014 Elsevier Interactive Patient Education  2017 [employer].

## 2021-01-26 ENCOUNTER — Ambulatory Visit (INDEPENDENT_AMBULATORY_CARE_PROVIDER_SITE_OTHER): Payer: PPO | Admitting: Family Medicine

## 2021-01-26 ENCOUNTER — Other Ambulatory Visit: Payer: Self-pay

## 2021-01-26 ENCOUNTER — Encounter: Payer: Self-pay | Admitting: Family Medicine

## 2021-01-26 VITALS — BP 124/70 | HR 93 | Temp 97.3°F | Ht 62.0 in | Wt 156.0 lb

## 2021-01-26 DIAGNOSIS — F418 Other specified anxiety disorders: Secondary | ICD-10-CM | POA: Diagnosis not present

## 2021-01-26 DIAGNOSIS — G4709 Other insomnia: Secondary | ICD-10-CM

## 2021-01-26 DIAGNOSIS — E213 Hyperparathyroidism, unspecified: Secondary | ICD-10-CM | POA: Diagnosis not present

## 2021-01-26 LAB — BASIC METABOLIC PANEL
BUN: 10 mg/dL (ref 6–23)
CO2: 28 mEq/L (ref 19–32)
Calcium: 10.9 mg/dL — ABNORMAL HIGH (ref 8.4–10.5)
Chloride: 105 mEq/L (ref 96–112)
Creatinine, Ser: 0.7 mg/dL (ref 0.40–1.20)
GFR: 90.26 mL/min (ref >60.00)
Glucose, Bld: 101 mg/dL — ABNORMAL HIGH (ref 70–99)
Potassium: 3.7 mEq/L (ref 3.5–5.1)
Sodium: 138 mEq/L (ref 135–145)

## 2021-01-26 LAB — CBC
HCT: 42.4 % (ref 36.0–46.0)
Hemoglobin: 13.8 g/dL (ref 12.0–15.0)
MCHC: 32.6 g/dL (ref 30.0–36.0)
MCV: 89.4 fl (ref 78.0–100.0)
Platelets: 223 10*3/uL (ref 150.0–400.0)
RBC: 4.74 Mil/uL (ref 3.87–5.11)
RDW: 13.6 % (ref 11.5–15.5)
WBC: 3.5 10*3/uL — ABNORMAL LOW (ref 4.0–10.5)

## 2021-01-26 MED ORDER — SERTRALINE HCL 50 MG PO TABS: 50.0000 mg | ORAL_TABLET | Freq: Every day | ORAL | 3 refills | Status: DC

## 2021-01-26 MED ORDER — ESZOPICLONE 2 MG PO TABS: 2.0000 mg | ORAL_TABLET | Freq: Every evening | ORAL | 0 refills | Status: DC | PRN

## 2021-01-26 NOTE — Progress Notes (Signed)
Established Patient Office Visit  Subjective:  Patient ID: Vicki George, female    DOB: 1955/10/10  Age: 66 y.o. MRN: [id number]  CC:  Chief Complaint  Patient presents with   Follow-up    3 month follow up, would like to increase meds for sleep. Also would like something for anxiety.     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  Surgery is scheduled for mid next month for excision of right superior parathyroid gland.  Recent studies confirmed.  Fortunately denies muscle spasms or cramping.  1 mg of Lunesta for sleep was less helpful than 2.  Denies sleep aberration.  She is anxious about her upcoming surgery.  Her son is now working.  Past Medical History:  Diagnosis Date   Chicken pox    Depression    Urinary tract infection     Past Surgical History:  Procedure Laterality Date   MYOMECTOMY     PARATHYROIDECTOMY Left 11/13/2019   Procedure: LEFT PARATHYROIDECTOMY;  Surgeon: Ralene Ok, MD;  Location: [facility] OR;  Service: General;  Laterality: Left;    Family History  Problem Relation Age of Onset   Hypercalcemia Neg Hx     Social History   Socioeconomic History   Marital status: Widowed    Spouse name: Not on file   Number of children: Not on file   Years of education: Not on file   Highest education level: Not on file  Occupational History   Not on file  Tobacco Use   Smoking status: Never   Smokeless tobacco: Never  Vaping Use   Vaping Use: Never used  Substance and Sexual Activity   Alcohol use: No   Drug use: No   Sexual activity: Not Currently  Other Topics Concern   Not on file  Social History Narrative   Not on file   Social Determinants of Health   Financial Resource Strain: Low Risk    Difficulty of Paying Living Expenses: Not hard at all  Food Insecurity: No Food Insecurity   Worried About Running Out of Food in the Last Year: Never true   [place] in the Last Year: Never true  Transportation  Needs: No Transportation Needs   Lack of Transportation (Medical): No   Lack of Transportation (Non-Medical): No  Physical Activity: Insufficiently Active   Days of Exercise per Week: 3 days   Minutes of Exercise per Session: 30 min  Stress: No Stress Concern Present   Feeling of Stress : Not at all  Social Connections: Moderately Isolated   Frequency of Communication with Friends and Family: Twice a week   Frequency of Social Gatherings with Friends and Family: Twice a week   Attends Religious Services: More than 4 times per year   Active Member of [employer] or Organizations: No   Attends Archivist Meetings: Never   Marital Status: Widowed  Human resources officer Violence: Not At Risk   Fear of Current or Ex-Partner: No   Emotionally Abused: No   Physically Abused: No   Sexually Abused: No    Outpatient Medications Prior to Visit  Medication Sig Dispense Refill   alendronate (FOSAMAX) 70 MG tablet Take 1 tablet (70 mg total) by mouth every 7 (seven) days. Take with a full glass of water on an empty stomach. 4 tablet 11   eszopiclone (LUNESTA) 1 MG TABS tablet Take 1 tablet (1 mg total) by mouth at bedtime as needed for sleep. Take  immediately before bedtime 30 tablet 2   No facility-administered medications prior to visit.    Allergies  Allergen Reactions   Aspirin Other (See Comments)    Upset stomach    ROS Review of Systems  Constitutional:  Negative for diaphoresis, fatigue, fever and unexpected weight change.  HENT: Negative.    Eyes:  Negative for photophobia and visual disturbance.  Respiratory: Negative.    Cardiovascular: Negative.   Gastrointestinal: Negative.   Endocrine: Negative for polyphagia and polyuria.  Musculoskeletal:  Negative for myalgias.  Neurological:  Negative for weakness.  Psychiatric/Behavioral:  Positive for sleep disturbance.      Depression screen [facility] 2/9 01/26/2021 01/26/2021 01/25/2021  Decreased Interest 0 0 0  Down, Depressed,  Hopeless 0 0 0  PHQ - 2 Score 0 0 0  Altered sleeping 1 - -  Tired, decreased energy 0 - -  Change in appetite 0 - -  Feeling bad or failure about yourself  0 - -  Trouble concentrating 0 - -  Moving slowly or fidgety/restless 0 - -  Suicidal thoughts 0 - -  PHQ-9 Score 1 - -  Difficult doing work/chores Not difficult at all - -     Objective:    Physical Exam Vitals and nursing note reviewed.  Constitutional:      General: She is not in acute distress.    Appearance: Normal appearance. She is not ill-appearing, toxic-appearing or diaphoretic.  HENT:     Head: Normocephalic and atraumatic.     Right Ear: External ear normal.     Left Ear: External ear normal.  Eyes:     General: No scleral icterus.       Right eye: No discharge.        Left eye: No discharge.     Extraocular Movements: Extraocular movements intact.  Pulmonary:     Effort: Pulmonary effort is normal.  Skin:    General: Skin is warm and dry.  Neurological:     Mental Status: She is alert and oriented to person, place, and time.  Psychiatric:        Mood and Affect: Mood normal.        Behavior: Behavior normal.    BP 124/70 (BP Location: Right Arm, Patient Position: Sitting, Cuff Size: Normal)    Pulse 93    Temp (!) 97.3 F (36.3 C) (Temporal)    Ht 5\' 2"  (1.575 m)    Wt 156 lb (70.8 kg)    SpO2 95%    BMI 28.53 kg/m  Wt Readings from Last 3 Encounters:  01/26/21 156 lb (70.8 kg)  10/26/20 155 lb 3.2 oz (70.4 kg)  10/20/20 155 lb 3.2 oz (70.4 kg)     Health Maintenance Due  Topic Date Due   TETANUS/TDAP  Never done   Zoster Vaccines- Shingrix (1 of 2) Never done   Pneumonia Vaccine 65+ Years old (1 - PCV) Never done   Fecal DNA (Cologuard)  12/11/2020    There are no preventive care reminders to display for this patient.  Lab Results  Component Value Date   TSH 2.07 12/13/2016   Lab Results  Component Value Date   WBC 4.1 04/20/2020   HGB 14.3 04/20/2020   HCT 42.4 04/20/2020   MCV  88.7 04/20/2020   PLT 212.0 04/20/2020   Lab Results  Component Value Date   NA 138 04/20/2020   K 4.9 04/23/2020   CO2 30 04/20/2020   GLUCOSE 104 (H) 04/20/2020  BUN 15 04/20/2020   CREATININE 0.63 04/20/2020   BILITOT 0.9 04/20/2020   ALKPHOS 149 (H) 04/20/2020   AST 19 04/20/2020   ALT 16 04/20/2020   PROT 7.1 04/20/2020   ALBUMIN 4.1 04/20/2020   CALCIUM 12.1 (H) 10/20/2020   ANIONGAP 5 11/14/2019   GFR 93.08 04/20/2020   Lab Results  Component Value Date   CHOL 162 04/20/2020   Lab Results  Component Value Date   HDL 41.50 04/20/2020   Lab Results  Component Value Date   LDLCALC 99 04/20/2020   Lab Results  Component Value Date   TRIG 105.0 04/20/2020   Lab Results  Component Value Date   CHOLHDL 4 04/20/2020   No results found for: HGBA1C    Assessment & Plan:   Problem List Items Addressed This Visit       Endocrine   Hyperparathyroidism, unspecified ([place]) - Primary   Relevant Orders   CBC   Basic metabolic panel   PTH, intact (no Ca)     Other   Other insomnia   Relevant Medications   eszopiclone (LUNESTA) 2 MG TABS tablet   Hypercalcemia   Relevant Orders   Basic metabolic panel   Other Visit Diagnoses     Anxiety about health       Relevant Medications   sertraline (ZOLOFT) 50 MG tablet       Meds ordered this encounter  Medications   eszopiclone (LUNESTA) 2 MG TABS tablet    Sig: Take 1 tablet (2 mg total) by mouth at bedtime as needed for sleep. Take immediately before bedtime    Dispense:  90 tablet    Refill:  0   sertraline (ZOLOFT) 50 MG tablet    Sig: Take 1 tablet (50 mg total) by mouth daily.    Dispense:  30 tablet    Refill:  3    Follow-up: Return in about 2 months (around 03/26/2021).  Have increased Lunesta to 2 mg daily.  While Paxil worked in the past, believes that Zoloft may be a better choice.  She will try it for 6 to 8 weeks and let me know.  We may need to switch back to the Paxil.  Libby Maw, MD

## 2021-01-27 LAB — PARATHYROID HORMONE, INTACT (NO CA): PTH: 211 pg/mL — ABNORMAL HIGH (ref 16–77)

## 2021-02-08 ENCOUNTER — Telehealth: Payer: Self-pay

## 2021-02-08 NOTE — Telephone Encounter (Signed)
Patient calling wanting to know if she should refill and take Fosamax this week and next wee. Per patient her surgery will be next Tuesday not sure if she should still take this medication. Please advise.

## 2021-02-08 NOTE — Telephone Encounter (Signed)
Patient aware of message below.

## 2021-02-10 NOTE — Progress Notes (Signed)
Surgical Instructions    Your procedure is scheduled on 02/15/21.  Report to [facility] Main Entrance "A" at 5:30 A.M., then check in with the Admitting office.  Call this number if you have problems the morning of surgery:  [phone]   If you have any questions prior to your surgery date call [phone]: Open Monday-Friday 8am-4pm    Remember:  Do not eat after midnight the night before your surgery  You may drink clear liquids until 4:30am the morning of your surgery.   Clear liquids allowed are: Water, Non-Citrus Juices (without pulp), Carbonated Beverages, Clear Tea, Black Coffee ONLY (NO MILK, CREAM OR POWDERED CREAMER of any kind), and Gatorade  Patient Instructions  The night before surgery:  No food after midnight. ONLY clear liquids after midnight  The day of surgery (if you do NOT have diabetes):  Drink ONE (1) Pre-Surgery Clear Ensure by 4:30am the morning of surgery. Drink in one sitting. Do not sip.  This drink was given to you during your hospital  pre-op appointment visit. Nothing else to drink after completing the  Pre-Surgery Clear Ensure.           If you have questions, please contact your surgeons office.     Take these medicines the morning of surgery with A SIP OF WATER:  alendronate (FOSAMAX)   As of today, STOP taking any Aspirin (unless otherwise instructed by your surgeon) Aleve, Naproxen, Ibuprofen, Motrin, Advil, Goody's, BC's, all herbal medications, fish oil, and all vitamins.           Do not wear jewelry or makeup Do not wear lotions, powders, perfumes/colognes, or deodorant. Do not shave 48 hours prior to surgery.   Do not bring valuables to the hospital. Do not wear nail polish, gel polish, artificial nails, or any other type of covering on natural nails (fingers and toes) If you have artificial nails or gel coating that need to be removed by a nail salon, please have this removed prior to surgery. Artificial nails or gel coating  may interfere with anesthesia's ability to adequately monitor your vital signs.  [facility] is not responsible for any belongings or valuables. .   Do NOT Smoke (Tobacco/Vaping)  24 hours prior to your procedure  If you use a CPAP at night, you may bring your mask for your overnight stay.   Contacts, glasses, hearing aids, dentures or partials may not be worn into surgery, please bring cases for these belongings   For patients admitted to the hospital, discharge time will be determined by your treatment team.   Patients discharged the day of surgery will not be allowed to drive home, and someone needs to stay with them for 24 hours.  NO VISITORS WILL BE ALLOWED IN PRE-OP WHERE PATIENTS ARE PREPPED FOR SURGERY.  ONLY 1 SUPPORT PERSON MAY BE PRESENT IN THE WAITING ROOM WHILE YOU ARE IN SURGERY.  IF YOU ARE TO BE ADMITTED, ONCE YOU ARE IN YOUR ROOM YOU WILL BE ALLOWED TWO (2) VISITORS. 1 (ONE) VISITOR MAY STAY OVERNIGHT BUT MUST ARRIVE TO THE ROOM BY 8pm.  Minor children may have two parents present. Special consideration for safety and communication needs will be reviewed on a case by case basis.  Special instructions:    Oral Hygiene is also important to reduce your risk of infection.  Remember - BRUSH YOUR TEETH THE MORNING OF SURGERY WITH YOUR REGULAR TOOTHPASTE   [place]- Preparing For Surgery  Before surgery, you can play  an important role. Because skin is not sterile, your skin needs to be as free of germs as possible. You can reduce the number of germs on your skin by washing with CHG (chlorahexidine gluconate) Soap before surgery.  CHG is an antiseptic cleaner which kills germs and bonds with the skin to continue killing germs even after washing.     Please do not use if you have an allergy to CHG or antibacterial soaps. If your skin becomes reddened/irritated stop using the CHG.  Do not shave (including legs and underarms) for at least 48 hours prior to first CHG shower. It is  OK to shave your face.  Please follow these instructions carefully.     Shower the NIGHT BEFORE SURGERY and the MORNING OF SURGERY with CHG Soap.   If you chose to wash your hair, wash your hair first as usual with your normal shampoo. After you shampoo, rinse your hair and body thoroughly to remove the shampoo.  Then [employer] and genitals (private parts) with your normal soap and rinse thoroughly to remove soap.  After that Use CHG Soap as you would any other liquid soap. You can apply CHG directly to the skin and wash gently with a scrungie or a clean washcloth.   Apply the CHG Soap to your body ONLY FROM THE NECK DOWN.  Do not use on open wounds or open sores. Avoid contact with your eyes, ears, mouth and genitals (private parts). Wash Face and genitals (private parts)  with your normal soap.   Wash thoroughly, paying special attention to the area where your surgery will be performed.  Thoroughly rinse your body with warm water from the neck down.  DO NOT shower/wash with your normal soap after using and rinsing off the CHG Soap.  Pat yourself dry with a CLEAN TOWEL.  Wear CLEAN PAJAMAS to bed the night before surgery  Place CLEAN SHEETS on your bed the night before your surgery  DO NOT SLEEP WITH PETS.   Day of Surgery: Take a shower with CHG soap. Wear Clean/Comfortable clothing the morning of surgery Do not apply any deodorants/lotions.   Remember to brush your teeth WITH YOUR REGULAR TOOTHPASTE.    COVID testing  If you are going to stay overnight or be admitted after your procedure/surgery and require a pre-op COVID test, please follow these instructions after your COVID test   You are not required to quarantine however you are required to wear a well-fitting mask when you are out and around people not in your household.  If your mask becomes wet or soiled, replace with a new one.  Wash your hands often with soap and water for 20 seconds or clean your hands with an  alcohol-based hand sanitizer that contains at least 60% alcohol.  Do not share personal items.  Notify your provider: if you are in close contact with someone who has COVID  or if you develop a fever of 100.4 or greater, sneezing, cough, sore throat, shortness of breath or body aches.    Please read over the following fact sheets that you were given.

## 2021-02-11 ENCOUNTER — Encounter (HOSPITAL_COMMUNITY)
Admission: RE | Admit: 2021-02-11 | Discharge: 2021-02-11 | Disposition: A | Discharge status: Home / Self Care | Payer: PPO | Source: Ambulatory Visit | Attending: General Surgery | Admitting: General Surgery

## 2021-02-11 ENCOUNTER — Encounter (HOSPITAL_COMMUNITY): Payer: Self-pay

## 2021-02-11 ENCOUNTER — Other Ambulatory Visit: Payer: Self-pay

## 2021-02-11 VITALS — BP 142/81 | HR 93 | Temp 97.7°F | Resp 17 | Ht 62.0 in | Wt 157.9 lb

## 2021-02-11 DIAGNOSIS — Z01812 Encounter for preprocedural laboratory examination: Secondary | ICD-10-CM | POA: Diagnosis not present

## 2021-02-11 DIAGNOSIS — Z01818 Encounter for other preprocedural examination: Secondary | ICD-10-CM

## 2021-02-11 DIAGNOSIS — Z20822 Contact with and (suspected) exposure to covid-19: Secondary | ICD-10-CM | POA: Diagnosis not present

## 2021-02-11 LAB — SARS CORONAVIRUS 2 (TAT 6-24 HRS): SARS Coronavirus 2: NEGATIVE

## 2021-02-11 NOTE — Progress Notes (Signed)
Surgical Instructions    Your procedure is scheduled on 02/15/21.  Report to [facility] Main Entrance "A" at 5:30 A.M., then check in with the Admitting office.  Call this number if you have problems the morning of surgery:  [phone]   If you have any questions prior to your surgery date call [phone]: Open Monday-Friday 8am-4pm    Remember:  Do not eat after midnight the night before your surgery  You may drink clear liquids until 4:30am the morning of your surgery.   Clear liquids allowed are: Water, Non-Citrus Juices (without pulp), Carbonated Beverages, Clear Tea, Black Coffee ONLY (NO MILK, CREAM OR POWDERED CREAMER of any kind), and Gatorade  Patient Instructions  The night before surgery:  No food after midnight. ONLY clear liquids after midnight  The day of surgery (if you do NOT have diabetes):  Drink ONE (1) Pre-Surgery Clear Ensure by 4:30am the morning of surgery. Drink in one sitting. Do not sip.  This drink was given to you during your hospital  pre-op appointment visit. Nothing else to drink after completing the  Pre-Surgery Clear Ensure.           If you have questions, please contact your surgeons office.     Take these medicines the morning of surgery with A SIP OF WATER: NONE    As of today, STOP taking any Aspirin (unless otherwise instructed by your surgeon) Aleve, Naproxen, Ibuprofen, Motrin, Advil, Goody's, BC's, all herbal medications, fish oil, and all vitamins.           Do not wear jewelry or makeup Do not wear lotions, powders, perfumes/colognes, or deodorant. Do not shave 48 hours prior to surgery.   Do not bring valuables to the hospital. Do not wear nail polish, gel polish, artificial nails, or any other type of covering on natural nails (fingers and toes) If you have artificial nails or gel coating that need to be removed by a nail salon, please have this removed prior to surgery. Artificial nails or gel coating may interfere with  anesthesia's ability to adequately monitor your vital signs.  [facility] is not responsible for any belongings or valuables. .   Do NOT Smoke (Tobacco/Vaping)  24 hours prior to your procedure  If you use a CPAP at night, you may bring your mask for your overnight stay.   Contacts, glasses, hearing aids, dentures or partials may not be worn into surgery, please bring cases for these belongings   For patients admitted to the hospital, discharge time will be determined by your treatment team.   Patients discharged the day of surgery will not be allowed to drive home, and someone needs to stay with them for 24 hours.  NO VISITORS WILL BE ALLOWED IN PRE-OP WHERE PATIENTS ARE PREPPED FOR SURGERY.  ONLY 1 SUPPORT PERSON MAY BE PRESENT IN THE WAITING ROOM WHILE YOU ARE IN SURGERY.  IF YOU ARE TO BE ADMITTED, ONCE YOU ARE IN YOUR ROOM YOU WILL BE ALLOWED TWO (2) VISITORS. 1 (ONE) VISITOR MAY STAY OVERNIGHT BUT MUST ARRIVE TO THE ROOM BY 8pm.  Minor children may have two parents present. Special consideration for safety and communication needs will be reviewed on a case by case basis.  Special instructions:    Oral Hygiene is also important to reduce your risk of infection.  Remember - BRUSH YOUR TEETH THE MORNING OF SURGERY WITH YOUR REGULAR TOOTHPASTE   [place]- Preparing For Surgery  Before surgery, you can play an  important role. Because skin is not sterile, your skin needs to be as free of germs as possible. You can reduce the number of germs on your skin by washing with CHG (chlorahexidine gluconate) Soap before surgery.  CHG is an antiseptic cleaner which kills germs and bonds with the skin to continue killing germs even after washing.     Please do not use if you have an allergy to CHG or antibacterial soaps. If your skin becomes reddened/irritated stop using the CHG.  Do not shave (including legs and underarms) for at least 48 hours prior to first CHG shower. It is OK to shave your  face.  Please follow these instructions carefully.     Shower the NIGHT BEFORE SURGERY and the MORNING OF SURGERY with CHG Soap.   If you chose to wash your hair, wash your hair first as usual with your normal shampoo. After you shampoo, rinse your hair and body thoroughly to remove the shampoo.  Then [employer] and genitals (private parts) with your normal soap and rinse thoroughly to remove soap.  After that Use CHG Soap as you would any other liquid soap. You can apply CHG directly to the skin and wash gently with a scrungie or a clean washcloth.   Apply the CHG Soap to your body ONLY FROM THE NECK DOWN.  Do not use on open wounds or open sores. Avoid contact with your eyes, ears, mouth and genitals (private parts). Wash Face and genitals (private parts)  with your normal soap.   Wash thoroughly, paying special attention to the area where your surgery will be performed.  Thoroughly rinse your body with warm water from the neck down.  DO NOT shower/wash with your normal soap after using and rinsing off the CHG Soap.  Pat yourself dry with a CLEAN TOWEL.  Wear CLEAN PAJAMAS to bed the night before surgery  Place CLEAN SHEETS on your bed the night before your surgery  DO NOT SLEEP WITH PETS.   Day of Surgery: Take a shower with CHG soap. Wear Clean/Comfortable clothing the morning of surgery Do not apply any deodorants/lotions.   Remember to brush your teeth WITH YOUR REGULAR TOOTHPASTE.    COVID testing  If you are going to stay overnight or be admitted after your procedure/surgery and require a pre-op COVID test, please follow these instructions after your COVID test   You are not required to quarantine however you are required to wear a well-fitting mask when you are out and around people not in your household.  If your mask becomes wet or soiled, replace with a new one.  Wash your hands often with soap and water for 20 seconds or clean your hands with an alcohol-based hand  sanitizer that contains at least 60% alcohol.  Do not share personal items.  Notify your provider: if you are in close contact with someone who has COVID  or if you develop a fever of 100.4 or greater, sneezing, cough, sore throat, shortness of breath or body aches.    Please read over the following fact sheets that you were given.

## 2021-02-11 NOTE — Progress Notes (Signed)
PCP - Mortimer Fries Kremer,MD Cardiologist - none  PPM/ICD - denies Device Orders -  Rep Notified -   Chest x-ray - none EKG - none Stress Test - none ECHO - none Cardiac Cath - none  Sleep Study - no CPAP -   Fasting Blood Sugar - n/a Checks Blood Sugar _____ times a day  Blood Thinner Instructions:n/a Aspirin Instructions:n/a  ERAS Protcol - clear liquids until 0430 PRE-SURGERY Ensure or G2- Ensure  COVID TEST- yes-02/11/21   Anesthesia review: no   Patient denies shortness of breath, fever, cough and chest pain at PAT appointment   All instructions explained to the patient, with a verbal understanding of the material. Patient agrees to go over the instructions while at home for a better understanding. Patient also instructed to wear a mask while in public after being tested for COVID-19. The opportunity to ask questions was provided.

## 2021-02-14 ENCOUNTER — Ambulatory Visit: Payer: Self-pay | Admitting: General Surgery

## 2021-02-15 ENCOUNTER — Ambulatory Visit (HOSPITAL_BASED_OUTPATIENT_CLINIC_OR_DEPARTMENT_OTHER): Payer: PPO | Admitting: Certified Registered Nurse Anesthetist

## 2021-02-15 ENCOUNTER — Encounter (HOSPITAL_COMMUNITY): Payer: Self-pay | Admitting: General Surgery

## 2021-02-15 ENCOUNTER — Encounter (HOSPITAL_COMMUNITY)
Admission: RE | Disposition: A | Discharge status: Home / Self Care | Payer: Self-pay | Source: Home / Self Care | Attending: General Surgery

## 2021-02-15 ENCOUNTER — Other Ambulatory Visit: Payer: Self-pay

## 2021-02-15 ENCOUNTER — Observation Stay (HOSPITAL_COMMUNITY)
Admission: RE | Admit: 2021-02-15 | Discharge: 2021-02-16 | Disposition: A | Discharge status: Home / Self Care | Payer: PPO | Attending: General Surgery | Admitting: General Surgery

## 2021-02-15 ENCOUNTER — Ambulatory Visit (HOSPITAL_COMMUNITY): Payer: PPO | Admitting: Certified Registered Nurse Anesthetist

## 2021-02-15 DIAGNOSIS — E892 Postprocedural hypoparathyroidism: Secondary | ICD-10-CM

## 2021-02-15 DIAGNOSIS — D351 Benign neoplasm of parathyroid gland: Secondary | ICD-10-CM | POA: Diagnosis not present

## 2021-02-15 DIAGNOSIS — E210 Primary hyperparathyroidism: Secondary | ICD-10-CM

## 2021-02-15 HISTORY — PX: PARATHYROIDECTOMY: SHX19

## 2021-02-15 SURGERY — PARATHYROIDECTOMY
Anesthesia: General | Site: Neck | Laterality: Right

## 2021-02-15 MED ORDER — CHLORHEXIDINE GLUCONATE CLOTH 2 % EX PADS: 6.0000 | MEDICATED_PAD | Freq: Once | CUTANEOUS | Status: DC

## 2021-02-15 MED ORDER — ONDANSETRON HCL 4 MG/2ML IJ SOLN
INTRAMUSCULAR | Status: DC | PRN
Start: 2021-02-15 — End: 2021-02-15
  Administered 2021-02-15: 4 mg via INTRAVENOUS

## 2021-02-15 MED ORDER — PROPOFOL 10 MG/ML IV BOLUS
INTRAVENOUS | Status: AC
  Filled 2021-02-15: qty 40

## 2021-02-15 MED ORDER — ROCURONIUM BROMIDE 10 MG/ML (PF) SYRINGE
PREFILLED_SYRINGE | INTRAVENOUS | Status: DC | PRN
  Administered 2021-02-15: 50 mg via INTRAVENOUS

## 2021-02-15 MED ORDER — DEXTROSE-NACL 5-0.9 % IV SOLN: INTRAVENOUS | Status: DC

## 2021-02-15 MED ORDER — ENSURE PRE-SURGERY PO LIQD: 296.0000 mL | Freq: Once | ORAL | Status: DC

## 2021-02-15 MED ORDER — FENTANYL CITRATE (PF) 100 MCG/2ML IJ SOLN
INTRAMUSCULAR | Status: AC
  Filled 2021-02-15: qty 2

## 2021-02-15 MED ORDER — OXYCODONE HCL 5 MG PO TABS: 5.0000 mg | ORAL_TABLET | Freq: Once | ORAL | Status: DC | PRN

## 2021-02-15 MED ORDER — PROPOFOL 10 MG/ML IV BOLUS
INTRAVENOUS | Status: DC | PRN
  Administered 2021-02-15: 140 mg via INTRAVENOUS

## 2021-02-15 MED ORDER — LIDOCAINE 2% (20 MG/ML) 5 ML SYRINGE
INTRAMUSCULAR | Status: AC
  Filled 2021-02-15: qty 5

## 2021-02-15 MED ORDER — ONDANSETRON HCL 4 MG/2ML IJ SOLN: 4.0000 mg | Freq: Four times a day (QID) | INTRAMUSCULAR | Status: DC | PRN

## 2021-02-15 MED ORDER — CHLORHEXIDINE GLUCONATE 0.12 % MT SOLN
15.0000 mL | Freq: Once | OROMUCOSAL | Status: AC
  Administered 2021-02-15: 15 mL via OROMUCOSAL
  Filled 2021-02-15: qty 15

## 2021-02-15 MED ORDER — ZOLPIDEM TARTRATE 5 MG PO TABS: 5.0000 mg | ORAL_TABLET | Freq: Every evening | ORAL | Status: DC | PRN

## 2021-02-15 MED ORDER — MIDAZOLAM HCL 2 MG/2ML IJ SOLN
INTRAMUSCULAR | Status: AC
  Filled 2021-02-15: qty 2

## 2021-02-15 MED ORDER — SUGAMMADEX SODIUM 200 MG/2ML IV SOLN
INTRAVENOUS | Status: DC | PRN
  Administered 2021-02-15: 200 mg via INTRAVENOUS

## 2021-02-15 MED ORDER — PHENYLEPHRINE 40 MCG/ML (10ML) SYRINGE FOR IV PUSH (FOR BLOOD PRESSURE SUPPORT)
PREFILLED_SYRINGE | INTRAVENOUS | Status: DC | PRN
  Administered 2021-02-15: 120 ug via INTRAVENOUS
  Administered 2021-02-15: 80 ug via INTRAVENOUS

## 2021-02-15 MED ORDER — BUPIVACAINE HCL (PF) 0.25 % IJ SOLN
INTRAMUSCULAR | Status: DC | PRN
  Administered 2021-02-15: 8 mL

## 2021-02-15 MED ORDER — CEFAZOLIN SODIUM-DEXTROSE 2-4 GM/100ML-% IV SOLN
2.0000 g | INTRAVENOUS | Status: AC
  Administered 2021-02-15: 2 g via INTRAVENOUS
  Filled 2021-02-15: qty 100

## 2021-02-15 MED ORDER — ACETAMINOPHEN 500 MG PO TABS
1000.0000 mg | ORAL_TABLET | ORAL | Status: AC
  Administered 2021-02-15: 1000 mg via ORAL
  Filled 2021-02-15: qty 2

## 2021-02-15 MED ORDER — DEXAMETHASONE SODIUM PHOSPHATE 10 MG/ML IJ SOLN
INTRAMUSCULAR | Status: DC | PRN
  Administered 2021-02-15: 8 mg via INTRAVENOUS

## 2021-02-15 MED ORDER — FENTANYL CITRATE (PF) 250 MCG/5ML IJ SOLN
INTRAMUSCULAR | Status: AC
  Filled 2021-02-15: qty 5

## 2021-02-15 MED ORDER — FENTANYL CITRATE (PF) 250 MCG/5ML IJ SOLN
INTRAMUSCULAR | Status: DC | PRN
  Administered 2021-02-15: 50 ug via INTRAVENOUS
  Administered 2021-02-15: 100 ug via INTRAVENOUS

## 2021-02-15 MED ORDER — ONDANSETRON 4 MG PO TBDP: 4.0000 mg | ORAL_TABLET | Freq: Four times a day (QID) | ORAL | Status: DC | PRN

## 2021-02-15 MED ORDER — BUPIVACAINE HCL (PF) 0.25 % IJ SOLN
INTRAMUSCULAR | Status: AC
  Filled 2021-02-15: qty 30

## 2021-02-15 MED ORDER — 0.9 % SODIUM CHLORIDE (POUR BTL) OPTIME
TOPICAL | Status: DC | PRN
  Administered 2021-02-15: 1000 mL

## 2021-02-15 MED ORDER — LACTATED RINGERS IV SOLN: INTRAVENOUS | Status: DC

## 2021-02-15 MED ORDER — LIDOCAINE 2% (20 MG/ML) 5 ML SYRINGE
INTRAMUSCULAR | Status: DC | PRN
Start: 2021-02-15 — End: 2021-02-15
  Administered 2021-02-15: 50 mg via INTRAVENOUS

## 2021-02-15 MED ORDER — STERILE WATER FOR IRRIGATION IR SOLN
Status: DC | PRN
  Administered 2021-02-15: 1000 mL

## 2021-02-15 MED ORDER — FENTANYL CITRATE (PF) 100 MCG/2ML IJ SOLN
25.0000 ug | INTRAMUSCULAR | Status: DC | PRN
  Administered 2021-02-15 (×3): 25 ug via INTRAVENOUS

## 2021-02-15 MED ORDER — TRAMADOL HCL 50 MG PO TABS
50.0000 mg | ORAL_TABLET | Freq: Four times a day (QID) | ORAL | Status: DC | PRN
  Administered 2021-02-15 – 2021-02-16 (×2): 50 mg via ORAL
  Filled 2021-02-15 (×2): qty 1

## 2021-02-15 MED ORDER — ORAL CARE MOUTH RINSE: 15.0000 mL | Freq: Once | OROMUCOSAL | Status: AC

## 2021-02-15 MED ORDER — HEMOSTATIC AGENTS (NO CHARGE) OPTIME
TOPICAL | Status: DC | PRN
  Administered 2021-02-15: 1

## 2021-02-15 MED ORDER — OXYCODONE HCL 5 MG/5ML PO SOLN: 5.0000 mg | Freq: Once | ORAL | Status: DC | PRN

## 2021-02-15 MED ORDER — OYSTER SHELL CALCIUM/D3 500-5 MG-MCG PO TABS
2.0000 | ORAL_TABLET | Freq: Two times a day (BID) | ORAL | Status: DC
  Administered 2021-02-15 – 2021-02-16 (×3): 2 via ORAL
  Filled 2021-02-15 (×3): qty 2

## 2021-02-15 MED ORDER — MAGNESIUM OXIDE -MG SUPPLEMENT 400 (240 MG) MG PO TABS
400.0000 mg | ORAL_TABLET | Freq: Every day | ORAL | Status: DC
  Administered 2021-02-15 – 2021-02-16 (×2): 400 mg via ORAL
  Filled 2021-02-15 (×2): qty 1

## 2021-02-15 MED ORDER — PHENOL 1.4 % MT LIQD: 1.0000 | OROMUCOSAL | Status: DC | PRN

## 2021-02-15 MED ORDER — MIDAZOLAM HCL 2 MG/2ML IJ SOLN
INTRAMUSCULAR | Status: DC | PRN
  Administered 2021-02-15: 2 mg via INTRAVENOUS

## 2021-02-15 SURGICAL SUPPLY — 42 items
BAG COUNTER SPONGE SURGICOUNT (BAG) ×2 IMPLANT
CANISTER SUCT 3000ML PPV (MISCELLANEOUS) ×2 IMPLANT
CHLORAPREP W/TINT 26 (MISCELLANEOUS) ×2 IMPLANT
CLIP TI MEDIUM 24 (CLIP) ×1 IMPLANT
CLIP TI WIDE RED SMALL 24 (CLIP) ×1 IMPLANT
CLIP VESOCCLUDE MED 6/CT (CLIP) ×2 IMPLANT
CLIP VESOCCLUDE SM WIDE 6/CT (CLIP) ×2 IMPLANT
CNTNR URN SCR LID CUP LEK RST (MISCELLANEOUS) ×1 IMPLANT
CONT SPEC 4OZ STRL OR WHT (MISCELLANEOUS) ×2
COVER SURGICAL LIGHT HANDLE (MISCELLANEOUS) ×2 IMPLANT
DERMABOND ADHESIVE PROPEN (GAUZE/BANDAGES/DRESSINGS) ×1
DERMABOND ADVANCED (GAUZE/BANDAGES/DRESSINGS) ×1
DERMABOND ADVANCED .7 DNX12 (GAUZE/BANDAGES/DRESSINGS) IMPLANT
DERMABOND ADVANCED .7 DNX6 (GAUZE/BANDAGES/DRESSINGS) ×1 IMPLANT
DRAPE LAPAROTOMY 100X72 PEDS (DRAPES) ×2 IMPLANT
ELECT COATED BLADE 2.86 ST (ELECTRODE) ×2 IMPLANT
ELECT REM PT RETURN 9FT ADLT (ELECTROSURGICAL) ×2
ELECTRODE REM PT RTRN 9FT ADLT (ELECTROSURGICAL) ×1 IMPLANT
GAUZE 4X4 16PLY ~~LOC~~+RFID DBL (SPONGE) ×3 IMPLANT
GLOVE SRG 8 PF TXTR STRL LF DI (GLOVE) IMPLANT
GLOVE SURG SYN 7.5  E (GLOVE) ×1
GLOVE SURG SYN 7.5 E (GLOVE) ×1 IMPLANT
GLOVE SURG SYN 7.5 PF PI (GLOVE) ×1 IMPLANT
GLOVE SURG SYN 8.0 (GLOVE) ×2 IMPLANT
GLOVE SURG SYN 8.0 PF PI (GLOVE) IMPLANT
GLOVE SURG UNDER POLY LF SZ8 (GLOVE) ×1
GOWN STRL REUS W/ TWL LRG LVL3 (GOWN DISPOSABLE) ×2 IMPLANT
GOWN STRL REUS W/TWL LRG LVL3 (GOWN DISPOSABLE) ×1
GOWN STRL REUS W/TWL XL LVL3 (GOWN DISPOSABLE) ×2 IMPLANT
HEMOSTAT SURGICEL 2X4 FIBR (HEMOSTASIS) ×2 IMPLANT
KIT BASIN OR (CUSTOM PROCEDURE TRAY) ×2 IMPLANT
KIT TURNOVER KIT B (KITS) ×2 IMPLANT
NDL HYPO 25GX1X1/2 BEV (NEEDLE) IMPLANT
NEEDLE HYPO 25GX1X1/2 BEV (NEEDLE) ×2 IMPLANT
NS IRRIG 1000ML POUR BTL (IV SOLUTION) ×2 IMPLANT
PACK GENERAL/GYN (CUSTOM PROCEDURE TRAY) ×2 IMPLANT
PAD ARMBOARD 7.5X6 YLW CONV (MISCELLANEOUS) ×4 IMPLANT
SPONGE INTESTINAL PEANUT (DISPOSABLE) ×2 IMPLANT
SUT MNCRL AB 4-0 PS2 18 (SUTURE) ×2 IMPLANT
SUT VIC AB 3-0 SH 18 (SUTURE) ×2 IMPLANT
SYR CONTROL 10ML LL (SYRINGE) ×1 IMPLANT
TOWEL GREEN STERILE FF (TOWEL DISPOSABLE) ×2 IMPLANT

## 2021-02-15 NOTE — Anesthesia Procedure Notes (Signed)
Procedure Name: Intubation Date/Time: 02/15/2021 7:43 AM Performed by: Leonor Liv, CRNA Pre-anesthesia Checklist: Patient identified, Emergency Drugs available, Suction available and Patient being monitored Patient Re-evaluated:Patient Re-evaluated prior to induction Oxygen Delivery Method: Circle System Utilized Preoxygenation: Pre-oxygenation with 100% oxygen Induction Type: IV induction Ventilation: Mask ventilation without difficulty Laryngoscope Size: Mac and 3 Grade View: Grade II Tube type: Oral Number of attempts: 1 Airway Equipment and Method: Stylet and Oral airway Placement Confirmation: ETT inserted through vocal cords under direct vision, positive ETCO2 and breath sounds checked- equal and bilateral Secured at: 21 cm Tube secured with: Tape Dental Injury: Teeth and Oropharynx as per pre-operative assessment  Comments: Mouth opening noted to be tight, grade II view

## 2021-02-15 NOTE — H&P (Signed)
Chief Complaint: No chief complaint on file.       History of Present Illness: Vicki George is a 66 y.o. female who is seen today for primary hyperparathyroidism. Since patient last visit she underwent 24-hour urine calcium.  This did reveal a normal period for her urine calcium.  She also underwent a D and D3 labs.  The Vit D3 lab was elev. Pt also underwent localizing study.  This did show a R sup parathyroid location.  I went over these labs in detail with the patient.         Review of Systems: A complete review of systems was obtained from the patient.  I have reviewed this information and discussed as appropriate with the patient.  See HPI as well for other ROS.   Review of Systems  Constitutional: Negative.   HENT: Negative.   Eyes: Negative.   Respiratory: Negative.   Cardiovascular: Negative.   Gastrointestinal: Negative.   Genitourinary: Negative.   Musculoskeletal: Negative.   Skin: Negative.   Neurological: Negative.   Endo/Heme/Allergies: Negative.   Psychiatric/Behavioral: Negative.         Medical History: Past Medical History Past Medical History: Diagnosis        Date            Thyroid disease                  There is no problem list on file for this patient.     Past Surgical History Past Surgical History: Procedure       Laterality         Date            PARATHYROIDECTOMY                           Allergies Allergies Allergen           Reactions            Aspirin Nausea and Other (See Comments)                           Upset stomach         Current Outpatient Medications on File Prior to Visit Medication       Sig       Dispense         Refill            alendronate (FOSAMAX) 70 MG tablet          TAKE 1 TABLET BY MOUTH ONCE A WEEK TAKE WITH A FULL GLASS OF WATER ON AN EMPTY STOMACH                    No current facility-administered medications on file prior to visit.     Family History History reviewed. No pertinent family  history.     Social History   Tobacco Use Smoking Status           Never Smokeless Tobacco   Never     Social History Social History     Socioeconomic History            Marital status:  Unknown Tobacco Use            Smoking status:          Never            Smokeless tobacco:    Never Vaping  Use            Vaping Use:    Never used Substance and Sexual Activity            Alcohol use:    Never            Drug use:        Never       Objective:     There were no vitals filed for this visit.  There is no height or weight on file to calculate BMI.   Physical Exam Constitutional:      Appearance: Normal appearance.  HENT:     Head: Normocephalic and atraumatic.     Mouth/Throat:     Mouth: Mucous membranes are moist.     Pharynx: Oropharynx is clear.  Eyes:     General: No scleral icterus.    Pupils: Pupils are equal, round, and reactive to light.  Cardiovascular:     Rate and Rhythm: Normal rate and regular rhythm.     Pulses: Normal pulses.     Heart sounds: No murmur heard.   No friction rub. No gallop.  Pulmonary:     Effort: Pulmonary effort is normal. No respiratory distress.     Breath sounds: Normal breath sounds. No stridor.  Abdominal:     General: Abdomen is flat.  Musculoskeletal:        General: No swelling.  Skin:    General: Skin is warm.  Neurological:     General: No focal deficit present.     Mental Status: She is alert and oriented to person, place, and time. Mental status is at baseline.  Psychiatric:        Mood and Affect: Mood normal.        Thought Content: Thought content normal.        Judgment: Judgment normal.                Assessment and Plan:    Diagnoses and all orders for this visit:   Primary hyperparathyroidism (CMS-HCC)       50 F with R superior parathyroid adenoma.   We will proceed to the OR for r parathryoidectomy. I d/w her the risks and benefits of the procedure to include but not limited to:  infection, bleeding, possible need for more surgery.  PT voiced understanding and wishes to proceed.       No follow-ups on file. Ralene Ok, MD

## 2021-02-15 NOTE — Transfer of Care (Signed)
Immediate Anesthesia Transfer of Care Note  Patient: Vicki George  Procedure(s) Performed: RIGHT PARATHYROIDECTOMY (Right: Neck)  Patient Location: PACU  Anesthesia Type:General  Level of Consciousness: drowsy  Airway & Oxygen Therapy: Patient Spontanous Breathing  Post-op Assessment: Report given to RN, Post -op Vital signs reviewed and stable and Patient moving all extremities  Post vital signs: Reviewed and stable  Last Vitals:  Vitals Value Taken Time  BP 137/86 02/15/21 0920  Temp    Pulse 85 02/15/21 0922  Resp 24 02/15/21 0922  SpO2 98 % 02/15/21 0922  Vitals shown include unvalidated device data.  Last Pain:  Vitals:   02/15/21 0547  TempSrc:   PainSc: 0-No pain         Complications: No notable events documented.

## 2021-02-15 NOTE — Op Note (Signed)
02/15/2021  9:01 AM  PATIENT:  Vicki George  66 y.o. female  PRE-OPERATIVE DIAGNOSIS:  PRIMARY HYPERPARATHYROIDISM  POST-OPERATIVE DIAGNOSIS:  PRIMARY HYPERPARATHYROIDISM  PROCEDURE:  Procedure(s): RIGHT PARATHYROIDECTOMY (Right)  SURGEON:  Surgeon(s) and Role:    Ralene Ok, MD - Primary    Armandina Gemma, MD - Assisting who was essential at helping with exposure and identification of the anatomy.  ANESTHESIA:   local and general  EBL:  minimal   BLOOD ADMINISTERED:none  DRAINS: none   LOCAL MEDICATIONS USED:  BUPIVICAINE   SPECIMEN:  Source of Specimen:  right parathyroid ademoma  DISPOSITION OF SPECIMEN:  PATHOLOGY  COUNTS:  YES  TOURNIQUET:  * No tourniquets in log *  DICTATION: .Dragon Dictation Details of the procedure:  The patient was taken back to the operating room. The patient was placed in supine position with bilateral SCDs in place.  The patient was prepped and draped in the usual sterile fashion. After appropriate anitbiotics were confirmed, a time-out was confirmed and all facts were verified.   A right-sided 3 cm incision was made approximately 2 fingerbreadths above the sternal notch. Bovie cautery was used to maintain hemostasis dissection was carried down through the platysma. The platysma was elevated and flaps were created superiorly and inferiorly to the thyroid cartilage as well as the sternal notch, repsectively. The strap muscles were identified in the midline and separated. right-sided strap muscles were elevated off the anterior surface of the thyroid. This dissection was carried laterally. We proceeded to dissect away the right thyroid lobe from the surrounding musculature from the thyroid.   An area posterior to the right inferior pole was dissected.  We dissected what we believed to be a parathyroid adenoma.  This was elongated.  Its vasculature was clipped.  It was sent to pathology as a frozen specimen.    Upon dissecting the right  superior pole, an enlarge parathyroid gland was identified posteriorly in the precervical fascia.  This was dissected out from the surrounding tissue.  Once it was removed it was sent to Pathology for a frozen section confirmation.  The area was irrigated out. The dissection bed was hemostatic. We placed fibrillar hemostatic agent into the wound. Strap muscles were then reapproximated in the midline with interrupted 3-0 Vicryl stitches. The platysma was reapproximated using 3-0 Vicryl stitches in interrupted fashion. Skin was then reapproximated using a running subcuticular 4-0 Monocryl. The skin was then dressed with Dermabond.   Intraoperatively, Pathology confirmed that the second specimen was indeed hyperplastic parathyroid tissue.  The first specimen appeared to be thyroid tissue.  The patient was taken to the recovery room in stable condition.    PLAN OF CARE: Admit for overnight observation  PATIENT DISPOSITION:  PACU - hemodynamically stable.   Delay start of Pharmacological VTE agent (>24hrs) due to surgical blood loss or risk of bleeding: not applicable

## 2021-02-15 NOTE — Anesthesia Preprocedure Evaluation (Signed)
Anesthesia Evaluation  Patient identified by MRN, date of birth, ID band Patient awake    Reviewed: Allergy & Precautions, H&P , NPO status , Patient's Chart, lab work & pertinent test results  Airway Mallampati: II   Neck ROM: full    Dental   Pulmonary neg pulmonary ROS,    breath sounds clear to auscultation       Cardiovascular negative cardio ROS   Rhythm:regular Rate:Normal     Neuro/Psych PSYCHIATRIC DISORDERS Depression    GI/Hepatic   Endo/Other  hyperparathyroid  Renal/GU      Musculoskeletal   Abdominal   Peds  Hematology   Anesthesia Other Findings   Reproductive/Obstetrics                             Anesthesia Physical Anesthesia Plan  ASA: 2  Anesthesia Plan: General   Post-op Pain Management:    Induction: Intravenous  PONV Risk Score and Plan: 3 and Ondansetron, Dexamethasone, Midazolam and Treatment may vary due to age or medical condition  Airway Management Planned: Oral ETT  Additional Equipment:   Intra-op Plan:   Post-operative Plan: Extubation in OR  Informed Consent: I have reviewed the patients History and Physical, chart, labs and discussed the procedure including the risks, benefits and alternatives for the proposed anesthesia with the patient or authorized representative who has indicated his/her understanding and acceptance.       Plan Discussed with: CRNA, Anesthesiologist and Surgeon  Anesthesia Plan Comments:         Anesthesia Quick Evaluation

## 2021-02-16 ENCOUNTER — Encounter (HOSPITAL_COMMUNITY): Payer: Self-pay | Admitting: General Surgery

## 2021-02-16 DIAGNOSIS — E210 Primary hyperparathyroidism: Secondary | ICD-10-CM | POA: Diagnosis not present

## 2021-02-16 LAB — BASIC METABOLIC PANEL
Anion gap: 7 (ref 5–15)
BUN: 8 mg/dL (ref 8–23)
CO2: 24 mmol/L (ref 22–32)
Calcium: 10.8 mg/dL — ABNORMAL HIGH (ref 8.9–10.3)
Chloride: 108 mmol/L (ref 98–111)
Creatinine, Ser: 0.74 mg/dL (ref 0.44–1.00)
GFR, Estimated: >60 mL/min (ref >60)
Glucose, Bld: 134 mg/dL — ABNORMAL HIGH (ref 70–99)
Potassium: 4 mmol/L (ref 3.5–5.1)
Sodium: 139 mmol/L (ref 135–145)

## 2021-02-16 LAB — SURGICAL PATHOLOGY

## 2021-02-16 MED ORDER — MAGNESIUM OXIDE -MG SUPPLEMENT 400 (240 MG) MG PO TABS: 400.0000 mg | ORAL_TABLET | Freq: Every day | ORAL | 2 refills | Status: DC

## 2021-02-16 MED ORDER — OYSTER SHELL CALCIUM/D3 500-5 MG-MCG PO TABS: 1.0000 | ORAL_TABLET | Freq: Three times a day (TID) | ORAL | 0 refills | Status: DC

## 2021-02-16 NOTE — Anesthesia Postprocedure Evaluation (Signed)
Anesthesia Post Note  Patient: [name]  Procedure(s) Performed: RIGHT PARATHYROIDECTOMY (Right: Neck)     Patient location during evaluation: PACU Anesthesia Type: General Level of consciousness: awake and alert Pain management: pain level controlled Vital Signs Assessment: post-procedure vital signs reviewed and stable Respiratory status: spontaneous breathing, nonlabored ventilation, respiratory function stable and patient connected to nasal cannula oxygen Cardiovascular status: blood pressure returned to baseline and stable Postop Assessment: no apparent nausea or vomiting Anesthetic complications: no   No notable events documented.  Last Vitals:  Vitals:   02/16/21 0440 02/16/21 0800  BP: 137/85 119/77  Pulse: 77 93  Resp: 16 20  Temp: 36.6 C 36.7 C  SpO2: 100% 99%    Last Pain:  Vitals:   02/16/21 0800  TempSrc: Oral  PainSc:                  [place]

## 2021-02-16 NOTE — Discharge Summary (Signed)
Physician Discharge Summary  Patient ID: Vicki George MRN: [id number] DOB/AGE: 1955-10-22 66 y.o.  Admit date: 02/15/2021 Discharge date: 02/16/2021  Admission Diagnoses: s/p parathyroidectomy  Discharge Diagnoses:  Principal Problem:   S/P parathyroidectomy [facility])   Discharged Condition: good  Hospital Course: PT admitted post op.  Please see op note for details.  Pt did well post op and had min pain. She was tol PO well.  She was deemed stable for Dc and Dc'd home.  Ca2+ POD 1 was trending down  Consults: None  Significant Diagnostic Studies: labs: Ca: trending down  Treatments: surgery: as above  Discharge Exam: Blood pressure 137/85, pulse 77, temperature 97.8 F (36.6 C), temperature source Oral, resp. rate 16, height 5\' 2"  (1.575 m), weight 70.8 kg, SpO2 100 %. General appearance: alert and cooperative Incision/Wound: c/d/i  Disposition: Discharge disposition: 01-Home or Self Care       Discharge Instructions     Diet - low sodium heart healthy   Complete by: As directed    Increase activity slowly   Complete by: As directed       Allergies as of 02/16/2021       Reactions   Aspirin Nausea Only   Upset stomach   Latex Itching        Medication List     STOP taking these medications    alendronate 70 MG tablet Commonly known as: FOSAMAX       TAKE these medications    calcium-vitamin D 500-5 MG-MCG tablet Commonly known as: OSCAL WITH D Take 1 tablet by mouth 3 (three) times daily.   eszopiclone 2 MG Tabs tablet Commonly known as: LUNESTA Take 1 tablet (2 mg total) by mouth at bedtime as needed for sleep. Take immediately before bedtime   ibuprofen 200 MG tablet Commonly known as: ADVIL Take 400 mg by mouth every 6 (six) hours as needed for headache.   magnesium oxide 400 (240 Mg) MG tablet Commonly known as: MAG-OX Take 1 tablet (400 mg total) by mouth daily.   sertraline 50 MG tablet Commonly known as: ZOLOFT Take 1 tablet (50  mg total) by mouth daily.        Follow-up Information     Ralene Ok, MD. Schedule an appointment as soon as possible for a visit in 2 week(s).   Specialty: General Surgery Why: Post op visit Contact information: [address] [place] [address] [phone]                 Signed: Ralene Ok 02/16/2021, 7:55 AM

## 2021-02-16 NOTE — Progress Notes (Signed)
Discharge instructions given to patient, patient verbalizes understanding. Iv removed and medications reviewed. Patient verbalizes understanding of medication. Patient discharged

## 2021-03-08 DIAGNOSIS — E892 Postprocedural hypoparathyroidism: Secondary | ICD-10-CM | POA: Diagnosis not present

## 2021-03-08 DIAGNOSIS — E210 Primary hyperparathyroidism: Secondary | ICD-10-CM | POA: Diagnosis not present

## 2021-03-08 DIAGNOSIS — E213 Hyperparathyroidism, unspecified: Secondary | ICD-10-CM | POA: Diagnosis not present

## 2021-04-20 ENCOUNTER — Ambulatory Visit (INDEPENDENT_AMBULATORY_CARE_PROVIDER_SITE_OTHER): Payer: PPO | Admitting: Family Medicine

## 2021-04-20 ENCOUNTER — Encounter: Payer: Self-pay | Admitting: Family Medicine

## 2021-04-20 VITALS — BP 144/78 | HR 77 | Temp 97.1°F | Ht 62.0 in | Wt 159.4 lb

## 2021-04-20 DIAGNOSIS — E7800 Pure hypercholesterolemia, unspecified: Secondary | ICD-10-CM | POA: Diagnosis not present

## 2021-04-20 DIAGNOSIS — E213 Hyperparathyroidism, unspecified: Secondary | ICD-10-CM

## 2021-04-20 DIAGNOSIS — E892 Postprocedural hypoparathyroidism: Secondary | ICD-10-CM

## 2021-04-20 DIAGNOSIS — Z23 Encounter for immunization: Secondary | ICD-10-CM | POA: Diagnosis not present

## 2021-04-20 DIAGNOSIS — R030 Elevated blood-pressure reading, without diagnosis of hypertension: Secondary | ICD-10-CM | POA: Diagnosis not present

## 2021-04-20 LAB — URINALYSIS, ROUTINE W REFLEX MICROSCOPIC
Bilirubin Urine: NEGATIVE
Hgb urine dipstick: NEGATIVE
Ketones, ur: NEGATIVE
Leukocytes,Ua: NEGATIVE
Nitrite: NEGATIVE
Specific Gravity, Urine: 1.02 (ref 1.000–1.030)
Total Protein, Urine: NEGATIVE
Urine Glucose: NEGATIVE
Urobilinogen, UA: 0.2 (ref 0.0–1.0)
pH: 7 (ref 5.0–8.0)

## 2021-04-20 LAB — CBC
HCT: 43.3 % (ref 36.0–46.0)
Hemoglobin: 14.2 g/dL (ref 12.0–15.0)
MCHC: 32.8 g/dL (ref 30.0–36.0)
MCV: 89 fl (ref 78.0–100.0)
Platelets: 255 10*3/uL (ref 150.0–400.0)
RBC: 4.87 Mil/uL (ref 3.87–5.11)
RDW: 13.5 % (ref 11.5–15.5)
WBC: 3.4 10*3/uL — ABNORMAL LOW (ref 4.0–10.5)

## 2021-04-20 LAB — COMPREHENSIVE METABOLIC PANEL
ALT: 17 U/L (ref 0–35)
AST: 22 U/L (ref 0–37)
Albumin: 4.4 g/dL (ref 3.5–5.2)
Alkaline Phosphatase: 128 U/L — ABNORMAL HIGH (ref 39–117)
BUN: 16 mg/dL (ref 6–23)
CO2: 29 mEq/L (ref 19–32)
Calcium: 10.9 mg/dL — ABNORMAL HIGH (ref 8.4–10.5)
Chloride: 106 mEq/L (ref 96–112)
Creatinine, Ser: 0.7 mg/dL (ref 0.40–1.20)
GFR: 90.11 mL/min (ref >60.00)
Glucose, Bld: 115 mg/dL — ABNORMAL HIGH (ref 70–99)
Potassium: 4.5 mEq/L (ref 3.5–5.1)
Sodium: 140 mEq/L (ref 135–145)
Total Bilirubin: 1 mg/dL (ref 0.2–1.2)
Total Protein: 7.5 g/dL (ref 6.0–8.3)

## 2021-04-20 LAB — LIPID PANEL
Cholesterol: 174 mg/dL (ref 0–200)
HDL: 44.1 mg/dL (ref >39.00)
LDL Cholesterol: 110 mg/dL — ABNORMAL HIGH (ref 0–99)
NonHDL: 130.16
Total CHOL/HDL Ratio: 4
Triglycerides: 99 mg/dL (ref 0.0–149.0)
VLDL: 19.8 mg/dL (ref 0.0–40.0)

## 2021-04-20 NOTE — Progress Notes (Signed)
? ?Established Patient Office Visit ? ?Subjective   ?Patient ID: Vicki George, female    DOB: 18-May-1955  Age: 66 y.o. MRN: [id number] ? ?Chief Complaint  ?Patient presents with  ? Follow-up  ?  6 month follow up, no concerns. Patient fasting.   ? ? ?HPI follow-up status post recent parathyroidectomy.  Has done well postsurgically.  Has noticed an increase in energy that was not present prior to the surgery.  No longer feels stressed or has any difficulty sleeping.  Son continues to improve and is in [place] looking for jobs. ? ? ? ?Review of Systems  ?Constitutional:  Negative for chills, diaphoresis, malaise/fatigue and weight loss.  ?HENT: Negative.    ?Eyes: Negative.  Negative for blurred vision and double vision.  ?Respiratory: Negative.    ?Cardiovascular:  Negative for chest pain.  ?Gastrointestinal:  Negative for abdominal pain.  ?Genitourinary: Negative.   ?Musculoskeletal:  Negative for falls and myalgias.  ?Neurological:  Negative for speech change, loss of consciousness, weakness and headaches.  ?Psychiatric/Behavioral: Negative.  The patient is not nervous/anxious and does not have insomnia.   ? ?  ?Objective:  ?  ? ?BP (!) 144/78 (BP Location: Left Arm, Patient Position: Sitting, Cuff Size: Normal)   Pulse 77   Temp (!) 97.1 ?F (36.2 ?C) (Temporal)   Ht '5\' 2"'$  (1.575 m)   Wt 159 lb 6.4 oz (72.3 kg)   SpO2 98%   BMI 29.15 kg/m?  ?BP Readings from Last 3 Encounters:  ?04/20/21 (!) 144/78  ?02/16/21 119/77  ?02/11/21 (!) 142/81  ? ?Wt Readings from Last 3 Encounters:  ?04/20/21 159 lb 6.4 oz (72.3 kg)  ?02/15/21 156 lb (70.8 kg)  ?02/11/21 157 lb 14.4 oz (71.6 kg)  ? ?  ? ?Physical Exam ?Constitutional:   ?   General: She is not in acute distress. ?   Appearance: Normal appearance. She is not ill-appearing, toxic-appearing or diaphoretic.  ?HENT:  ?   Head: Normocephalic and atraumatic.  ?   Right Ear: External ear normal.  ?   Left Ear: External ear normal.  ?   Mouth/Throat:  ?   Mouth: Mucous  membranes are moist.  ?   Pharynx: Oropharynx is clear. No oropharyngeal exudate or posterior oropharyngeal erythema.  ?Eyes:  ?   General: No scleral icterus.    ?   Right eye: No discharge.     ?   Left eye: No discharge.  ?   Extraocular Movements: Extraocular movements intact.  ?   Conjunctiva/sclera: Conjunctivae normal.  ?   Pupils: Pupils are equal, round, and reactive to light.  ?Cardiovascular:  ?   Rate and Rhythm: Normal rate and regular rhythm.  ?Pulmonary:  ?   Effort: Pulmonary effort is normal. No respiratory distress.  ?   Breath sounds: Normal breath sounds.  ?Abdominal:  ?   General: Bowel sounds are normal.  ?Musculoskeletal:  ?   Cervical back: No rigidity or tenderness.  ?Skin: ?   General: Skin is warm and dry.  ? ?    ?Neurological:  ?   Mental Status: She is alert and oriented to person, place, and time.  ?Psychiatric:     ?   Mood and Affect: Mood normal.     ?   Behavior: Behavior normal.  ? ? ? ?No results found for any visits on 04/20/21. ? ? ? ?The 10-year ASCVD risk score (Arnett DK, et al., 2019) is: 9.7% ? ?  ?Assessment & Plan:  ? ?  Problem List Items Addressed This Visit   ? ?  ? Endocrine  ? Hyperparathyroidism, unspecified ([place])  ? Relevant Orders  ? Comprehensive metabolic panel  ? PTH, intact (no Ca)  ?  ? Other  ? Hypercalcemia  ? Relevant Orders  ? Comprehensive metabolic panel  ? PTH, intact (no Ca)  ? Elevated LDL cholesterol level  ? Relevant Orders  ? Comprehensive metabolic panel  ? Lipid panel  ? S/P parathyroidectomy ([place])  ? Relevant Orders  ? Comprehensive metabolic panel  ? Elevated BP without diagnosis of hypertension  ? Relevant Orders  ? CBC  ? Comprehensive metabolic panel  ? Urinalysis, Routine w reflex microscopic  ? Need for Tdap vaccination - Primary  ? Relevant Orders  ? Tdap vaccine greater than or equal to 7yo IM (Completed)  ? ? ?Return in about 3 months (around 07/20/2021).  ? ? ?Libby Maw, MD ? ?

## 2021-04-21 DIAGNOSIS — E892 Postprocedural hypoparathyroidism: Secondary | ICD-10-CM | POA: Diagnosis not present

## 2021-04-21 DIAGNOSIS — E213 Hyperparathyroidism, unspecified: Secondary | ICD-10-CM | POA: Diagnosis not present

## 2021-04-21 DIAGNOSIS — E210 Primary hyperparathyroidism: Secondary | ICD-10-CM | POA: Diagnosis not present

## 2021-04-21 LAB — PARATHYROID HORMONE, INTACT (NO CA): PTH: 156 pg/mL — ABNORMAL HIGH (ref 16–77)

## 2021-07-21 ENCOUNTER — Encounter: Payer: Self-pay | Admitting: Family Medicine

## 2021-07-21 ENCOUNTER — Ambulatory Visit (INDEPENDENT_AMBULATORY_CARE_PROVIDER_SITE_OTHER): Payer: PPO | Admitting: Family Medicine

## 2021-07-21 VITALS — BP 130/78 | HR 89 | Temp 96.5°F | Ht 62.0 in | Wt 154.8 lb

## 2021-07-21 DIAGNOSIS — E213 Hyperparathyroidism, unspecified: Secondary | ICD-10-CM | POA: Diagnosis not present

## 2021-07-21 DIAGNOSIS — Z1211 Encounter for screening for malignant neoplasm of colon: Secondary | ICD-10-CM | POA: Diagnosis not present

## 2021-07-21 DIAGNOSIS — R7309 Other abnormal glucose: Secondary | ICD-10-CM

## 2021-07-21 DIAGNOSIS — R030 Elevated blood-pressure reading, without diagnosis of hypertension: Secondary | ICD-10-CM | POA: Diagnosis not present

## 2021-07-21 LAB — COMPREHENSIVE METABOLIC PANEL WITH GFR
ALT: 17 U/L (ref 0–35)
AST: 22 U/L (ref 0–37)
Albumin: 4.4 g/dL (ref 3.5–5.2)
Alkaline Phosphatase: 146 U/L — ABNORMAL HIGH (ref 39–117)
BUN: 15 mg/dL (ref 6–23)
CO2: 29 meq/L (ref 19–32)
Calcium: 10.8 mg/dL — ABNORMAL HIGH (ref 8.4–10.5)
Chloride: 106 meq/L (ref 96–112)
Creatinine, Ser: 0.73 mg/dL (ref 0.40–1.20)
GFR: 85.54 mL/min
Glucose, Bld: 109 mg/dL — ABNORMAL HIGH (ref 70–99)
Potassium: 4.6 meq/L (ref 3.5–5.1)
Sodium: 142 meq/L (ref 135–145)
Total Bilirubin: 0.9 mg/dL (ref 0.2–1.2)
Total Protein: 7.3 g/dL (ref 6.0–8.3)

## 2021-07-21 LAB — HEMOGLOBIN A1C: Hgb A1c MFr Bld: 6.3 % (ref 4.6–6.5)

## 2021-07-21 NOTE — Progress Notes (Signed)
Established Patient Office Visit  Subjective   Patient ID: Vicki George, female    DOB: 1955-02-21  Age: 66 y.o. MRN: [id number]  Chief Complaint  Patient presents with   Follow-up    3 month f/u. No concerns. Fasting today.      HPI follow-up of elevated blood pressure and glucose, hyperparathyroidism status post parathyroidectomy 4 months ago.  Patient due for    Review of Systems  Constitutional:  Negative for chills, diaphoresis, malaise/fatigue and weight loss.  HENT: Negative.    Eyes: Negative.  Negative for blurred vision and double vision.  Respiratory: Negative.    Cardiovascular:  Negative for chest pain.  Gastrointestinal:  Negative for abdominal pain.  Genitourinary: Negative.   Musculoskeletal:  Negative for falls, myalgias and neck pain.  Neurological:  Negative for speech change, loss of consciousness and weakness.  Psychiatric/Behavioral: Negative.           04/20/2021    8:51 AM 01/26/2021    9:43 AM 01/26/2021    8:45 AM  Depression screen PHQ 2/9  Decreased Interest 0 0 0  Down, Depressed, Hopeless 0 0 0  PHQ - 2 Score 0 0 0  Altered sleeping  1   Tired, decreased energy  0   Change in appetite  0   Feeling bad or failure about yourself   0   Trouble concentrating  0   Moving slowly or fidgety/restless  0   Suicidal thoughts  0   PHQ-9 Score  1   Difficult doing work/chores  Not difficult at all      Objective:     BP 130/78   Pulse 89   Temp (!) 96.5 F (35.8 C) (Temporal)   Ht '5\' 2"'$  (1.575 m)   Wt 154 lb 12.8 oz (70.2 kg)   SpO2 97%   BMI 28.31 kg/m  Wt Readings from Last 3 Encounters:  07/21/21 154 lb 12.8 oz (70.2 kg)  04/20/21 159 lb 6.4 oz (72.3 kg)  02/15/21 156 lb (70.8 kg)      Physical Exam Constitutional:      General: She is not in acute distress.    Appearance: Normal appearance. She is not ill-appearing, toxic-appearing or diaphoretic.  HENT:     Head: Normocephalic and atraumatic.     Right Ear: External ear  normal.     Left Ear: External ear normal.     Mouth/Throat:     Mouth: Mucous membranes are moist.     Pharynx: Oropharynx is clear. No oropharyngeal exudate or posterior oropharyngeal erythema.  Eyes:     General: No scleral icterus.       Right eye: No discharge.        Left eye: No discharge.     Extraocular Movements: Extraocular movements intact.     Conjunctiva/sclera: Conjunctivae normal.     Pupils: Pupils are equal, round, and reactive to light.  Neck:     Thyroid: No thyroid mass, thyromegaly or thyroid tenderness.  Cardiovascular:     Rate and Rhythm: Normal rate and regular rhythm.  Pulmonary:     Effort: Pulmonary effort is normal. No respiratory distress.     Breath sounds: Normal breath sounds.  Abdominal:     General: Bowel sounds are normal.  Musculoskeletal:     Cervical back: No rigidity or tenderness.  Lymphadenopathy:     Cervical: No cervical adenopathy.  Skin:    General: Skin is warm and dry.  Neurological:  Mental Status: She is alert and oriented to person, place, and time.  Psychiatric:        Mood and Affect: Mood normal.        Behavior: Behavior normal.      No results found for any visits on 07/21/21.    The 10-year ASCVD risk score (Arnett DK, et al., 2019) is: 8.1%    Assessment & Plan:   Problem List Items Addressed This Visit       Endocrine   Hyperparathyroidism, unspecified ([place]) - Primary   Relevant Orders   Comprehensive metabolic panel   PTH, intact (no Ca)     Other   Screen for colon cancer   Hypercalcemia   Relevant Orders   Comprehensive metabolic panel   Elevated glucose   Relevant Orders   Hemoglobin A1c   Elevated BP without diagnosis of hypertension    Return in about 6 months (around 01/21/2022).  Continue healthy lifestyle.  With decreased weight blood pressure is back to normal.  Checking hemoglobin A1c regarding elevated glucose.  Given information on Cologuard and encouraged to pursue it.  She is  not interested in colonoscopy.  Rechecking PTH and calcium status post parathyroidectomy.  Information given about Shingrix and encouraged to see her pharmacy.  Follow-up in 6 months  Libby Maw, MD

## 2021-07-22 LAB — PARATHYROID HORMONE, INTACT (NO CA): PTH: 99 pg/mL — ABNORMAL HIGH (ref 16–77)

## 2021-09-23 DIAGNOSIS — Z961 Presence of intraocular lens: Secondary | ICD-10-CM | POA: Diagnosis not present

## 2021-09-23 DIAGNOSIS — H524 Presbyopia: Secondary | ICD-10-CM | POA: Diagnosis not present

## 2021-09-23 DIAGNOSIS — H04123 Dry eye syndrome of bilateral lacrimal glands: Secondary | ICD-10-CM | POA: Diagnosis not present

## 2021-11-07 ENCOUNTER — Other Ambulatory Visit (HOSPITAL_BASED_OUTPATIENT_CLINIC_OR_DEPARTMENT_OTHER): Payer: Self-pay | Admitting: Family Medicine

## 2021-11-07 DIAGNOSIS — Z1231 Encounter for screening mammogram for malignant neoplasm of breast: Secondary | ICD-10-CM

## 2021-11-29 ENCOUNTER — Inpatient Hospital Stay (HOSPITAL_BASED_OUTPATIENT_CLINIC_OR_DEPARTMENT_OTHER): Admission: RE | Admit: 2021-11-29 | Payer: PPO | Source: Ambulatory Visit

## 2022-01-04 ENCOUNTER — Ambulatory Visit (HOSPITAL_BASED_OUTPATIENT_CLINIC_OR_DEPARTMENT_OTHER): Payer: PPO

## 2022-01-11 ENCOUNTER — Encounter (HOSPITAL_BASED_OUTPATIENT_CLINIC_OR_DEPARTMENT_OTHER): Payer: Self-pay

## 2022-01-11 ENCOUNTER — Ambulatory Visit (HOSPITAL_BASED_OUTPATIENT_CLINIC_OR_DEPARTMENT_OTHER)
Admission: RE | Admit: 2022-01-11 | Discharge: 2022-01-11 | Disposition: A | Discharge status: Home / Self Care | Payer: PPO | Source: Ambulatory Visit | Attending: Family Medicine | Admitting: Family Medicine

## 2022-01-11 DIAGNOSIS — Z1231 Encounter for screening mammogram for malignant neoplasm of breast: Secondary | ICD-10-CM | POA: Insufficient documentation

## 2022-01-12 ENCOUNTER — Telehealth: Payer: Self-pay | Admitting: Family Medicine

## 2022-01-12 NOTE — Telephone Encounter (Signed)
Left message for patient to call back and schedule Medicare Annual Wellness Visit (AWV) in office.   If not able to come in office, please offer to do virtually or by telephone.  Left office number and my jabber [phone].  Last AWV:01/25/2021   Please schedule at anytime with Nurse Health Advisor.

## 2022-01-19 ENCOUNTER — Encounter: Payer: Self-pay | Admitting: Family Medicine

## 2022-01-19 ENCOUNTER — Ambulatory Visit (INDEPENDENT_AMBULATORY_CARE_PROVIDER_SITE_OTHER): Payer: PPO | Admitting: Family Medicine

## 2022-01-19 DIAGNOSIS — R252 Cramp and spasm: Secondary | ICD-10-CM

## 2022-01-19 DIAGNOSIS — R4589 Other symptoms and signs involving emotional state: Secondary | ICD-10-CM | POA: Insufficient documentation

## 2022-01-19 DIAGNOSIS — E875 Hyperkalemia: Secondary | ICD-10-CM | POA: Diagnosis not present

## 2022-01-19 DIAGNOSIS — F418 Other specified anxiety disorders: Secondary | ICD-10-CM | POA: Diagnosis not present

## 2022-01-19 DIAGNOSIS — R7309 Other abnormal glucose: Secondary | ICD-10-CM | POA: Diagnosis not present

## 2022-01-19 DIAGNOSIS — E7800 Pure hypercholesterolemia, unspecified: Secondary | ICD-10-CM | POA: Diagnosis not present

## 2022-01-19 DIAGNOSIS — E213 Hyperparathyroidism, unspecified: Secondary | ICD-10-CM | POA: Diagnosis not present

## 2022-01-19 DIAGNOSIS — Z23 Encounter for immunization: Secondary | ICD-10-CM

## 2022-01-19 DIAGNOSIS — R030 Elevated blood-pressure reading, without diagnosis of hypertension: Secondary | ICD-10-CM | POA: Diagnosis not present

## 2022-01-19 LAB — CBC
HCT: 42.7 % (ref 36.0–46.0)
Hemoglobin: 14.6 g/dL (ref 12.0–15.0)
MCHC: 34.2 g/dL (ref 30.0–36.0)
MCV: 90.2 fl (ref 78.0–100.0)
Platelets: 222 10*3/uL (ref 150.0–400.0)
RBC: 4.73 Mil/uL (ref 3.87–5.11)
RDW: 13.4 % (ref 11.5–15.5)
WBC: 3.5 10*3/uL — ABNORMAL LOW (ref 4.0–10.5)

## 2022-01-19 LAB — COMPREHENSIVE METABOLIC PANEL
ALT: 14 U/L (ref 0–35)
AST: 20 U/L (ref 0–37)
Albumin: 4.4 g/dL (ref 3.5–5.2)
Alkaline Phosphatase: 129 U/L — ABNORMAL HIGH (ref 39–117)
BUN: 12 mg/dL (ref 6–23)
CO2: 31 mEq/L (ref 19–32)
Calcium: 11.2 mg/dL — ABNORMAL HIGH (ref 8.4–10.5)
Chloride: 103 mEq/L (ref 96–112)
Creatinine, Ser: 0.66 mg/dL (ref 0.40–1.20)
GFR: 90.92 mL/min (ref >60.00)
Glucose, Bld: 89 mg/dL (ref 70–99)
Potassium: 5.5 mEq/L — ABNORMAL HIGH (ref 3.5–5.1)
Sodium: 140 mEq/L (ref 135–145)
Total Bilirubin: 0.8 mg/dL (ref 0.2–1.2)
Total Protein: 7.5 g/dL (ref 6.0–8.3)

## 2022-01-19 LAB — HEMOGLOBIN A1C: Hgb A1c MFr Bld: 6.2 % (ref 4.6–6.5)

## 2022-01-19 LAB — LIPID PANEL
Cholesterol: 178 mg/dL (ref 0–200)
HDL: 54.4 mg/dL (ref >39.00)
LDL Cholesterol: 105 mg/dL — ABNORMAL HIGH (ref 0–99)
NonHDL: 123.42
Total CHOL/HDL Ratio: 3
Triglycerides: 93 mg/dL (ref 0.0–149.0)
VLDL: 18.6 mg/dL (ref 0.0–40.0)

## 2022-01-19 LAB — TSH: TSH: 2.53 u[IU]/mL (ref 0.35–5.50)

## 2022-01-19 LAB — URINALYSIS, ROUTINE W REFLEX MICROSCOPIC
Bilirubin Urine: NEGATIVE
Hgb urine dipstick: NEGATIVE
Ketones, ur: NEGATIVE
Leukocytes,Ua: NEGATIVE
Nitrite: NEGATIVE
RBC / HPF: NONE SEEN (ref 0–?)
Specific Gravity, Urine: 1.015 (ref 1.000–1.030)
Total Protein, Urine: NEGATIVE
Urine Glucose: NEGATIVE
Urobilinogen, UA: 0.2 (ref 0.0–1.0)
pH: 7 (ref 5.0–8.0)

## 2022-01-19 LAB — MAGNESIUM: Magnesium: 2.2 mg/dL (ref 1.5–2.5)

## 2022-01-19 MED ORDER — ESCITALOPRAM OXALATE 10 MG PO TABS: 10.0000 mg | ORAL_TABLET | Freq: Every day | ORAL | 1 refills | Status: DC

## 2022-01-19 NOTE — Progress Notes (Addendum)
Established Patient Office Visit   Subjective:  Patient ID: Vicki George, female    DOB: [birth year]  Age: 66 y.o. MRN: [id number]  Chief Complaint  Patient presents with   Follow-up    Routine follow up on medication concerns about leg cramps becoming more frequent x 1 month.     HPI Encounter Diagnoses  Name Primary?   Hypercalcemia Yes   Hyperparathyroidism, unspecified (HCC)    Elevated BP without diagnosis of hypertension    Elevated LDL cholesterol level    Anxiety about health    Muscle cramps    Elevated glucose    Need for pneumococcal 20-valent conjugate vaccination    Hyperkalemia    For follow-up of above.  Has been up in [place] helping her sister who had sustained a broken ankle.  Blood pressure there has been running in the 120-130/70 to 80s.  Patient has been anxious since she completed therapy with Paxil.  She has been having some nighttime muscle cramping.   Review of Systems  Constitutional: Negative.   HENT: Negative.    Eyes:  Negative for blurred vision, discharge and redness.  Respiratory: Negative.    Cardiovascular: Negative.   Gastrointestinal:  Negative for abdominal pain.  Genitourinary: Negative.   Musculoskeletal:  Positive for myalgias.  Skin:  Negative for rash.  Neurological:  Negative for tingling, loss of consciousness and weakness.  Endo/Heme/Allergies:  Negative for polydipsia.  Psychiatric/Behavioral:  The patient is nervous/anxious.       01/19/2022   10:06 AM 04/20/2021    8:51 AM 01/26/2021    9:43 AM  Depression screen PHQ 2/9  Decreased Interest 0 0 0  Down, Depressed, Hopeless 0 0 0  PHQ - 2 Score 0 0 0  Altered sleeping   1  Tired, decreased energy   0  Change in appetite   0  Feeling bad or failure about yourself    0  Trouble concentrating   0  Moving slowly or fidgety/restless   0  Suicidal thoughts   0  PHQ-9 Score   1  Difficult doing work/chores   Not difficult at all      Current Outpatient Medications:     escitalopram (LEXAPRO) 10 MG tablet, Take 1 tablet (10 mg total) by mouth daily., Disp: 30 tablet, Rfl: 1   ibuprofen (ADVIL) 200 MG tablet, Take 400 mg by mouth every 6 (six) hours as needed for headache., Disp: , Rfl:    Objective:     BP (!) 146/86 (BP Location: Right Arm, Patient Position: Sitting, Cuff Size: Normal)   Pulse 77   Temp (!) 96.8 F (36 C) (Temporal)   Ht '5\' 2"'$  (1.575 m)   Wt 152 lb 12.8 oz (69.3 kg)   SpO2 98%   BMI 27.95 kg/m  BP Readings from Last 3 Encounters:  01/19/22 (!) 146/86  07/21/21 130/78  04/20/21 (!) 144/78   Wt Readings from Last 3 Encounters:  01/19/22 152 lb 12.8 oz (69.3 kg)  07/21/21 154 lb 12.8 oz (70.2 kg)  04/20/21 159 lb 6.4 oz (72.3 kg)      Physical Exam Constitutional:      General: She is not in acute distress.    Appearance: Normal appearance. She is not ill-appearing, toxic-appearing or diaphoretic.  HENT:     Head: Normocephalic and atraumatic.     Right Ear: External ear normal.     Left Ear: External ear normal.     Mouth/Throat:  Mouth: Mucous membranes are moist.     Pharynx: Oropharynx is clear. No oropharyngeal exudate or posterior oropharyngeal erythema.  Eyes:     General: No scleral icterus.       Right eye: No discharge.        Left eye: No discharge.     Extraocular Movements: Extraocular movements intact.     Conjunctiva/sclera: Conjunctivae normal.     Pupils: Pupils are equal, round, and reactive to light.  Cardiovascular:     Rate and Rhythm: Normal rate and regular rhythm.  Pulmonary:     Effort: Pulmonary effort is normal. No respiratory distress.     Breath sounds: Normal breath sounds.  Musculoskeletal:     Cervical back: No rigidity or tenderness.  Skin:    General: Skin is warm and dry.  Neurological:     Mental Status: She is alert and oriented to person, place, and time.  Psychiatric:        Mood and Affect: Mood normal.        Behavior: Behavior normal.      Results for  orders placed or performed in visit on 01/19/22  CBC  Result Value Ref Range   WBC 3.5 (L) 4.0 - 10.5 K/uL   RBC 4.73 3.87 - 5.11 Mil/uL   Platelets 222.0 150.0 - 400.0 K/uL   Hemoglobin 14.6 12.0 - 15.0 g/dL   HCT 42.7 36.0 - 46.0 %   MCV 90.2 78.0 - 100.0 fl   MCHC 34.2 30.0 - 36.0 g/dL   RDW 13.4 11.5 - 15.5 %  Comprehensive metabolic panel  Result Value Ref Range   Sodium 140 135 - 145 mEq/L   Potassium 5.5 No hemolysis seen (H) 3.5 - 5.1 mEq/L   Chloride 103 96 - 112 mEq/L   CO2 31 19 - 32 mEq/L   Glucose, Bld 89 70 - 99 mg/dL   BUN 12 6 - 23 mg/dL   Creatinine, Ser 0.66 0.40 - 1.20 mg/dL   Total Bilirubin 0.8 0.2 - 1.2 mg/dL   Alkaline Phosphatase 129 (H) 39 - 117 U/L   AST 20 0 - 37 U/L   ALT 14 0 - 35 U/L   Total Protein 7.5 6.0 - 8.3 g/dL   Albumin 4.4 3.5 - 5.2 g/dL   GFR 90.92 >60.00 mL/min   Calcium 11.2 (H) 8.4 - 10.5 mg/dL  Hemoglobin A1c  Result Value Ref Range   Hgb A1c MFr Bld 6.2 4.6 - 6.5 %  Lipid panel  Result Value Ref Range   Cholesterol 178 0 - 200 mg/dL   Triglycerides 93.0 0.0 - 149.0 mg/dL   HDL 54.40 >39.00 mg/dL   VLDL 18.6 0.0 - 40.0 mg/dL   LDL Cholesterol 105 (H) 0 - 99 mg/dL   Total CHOL/HDL Ratio 3    NonHDL 123.42   Urinalysis, Routine w reflex microscopic  Result Value Ref Range   Color, Urine YELLOW Yellow;Lt. Yellow;Straw;Dark Yellow;Amber;Green;Red;Brown   APPearance CLEAR Clear;Turbid;Slightly Cloudy;Cloudy   Specific Gravity, Urine 1.015 1.000 - 1.030   pH 7.0 5.0 - 8.0   Total Protein, Urine NEGATIVE Negative   Urine Glucose NEGATIVE Negative   Ketones, ur NEGATIVE Negative   Bilirubin Urine NEGATIVE Negative   Hgb urine dipstick NEGATIVE Negative   Urobilinogen, UA 0.2 0.0 - 1.0   Leukocytes,Ua NEGATIVE Negative   Nitrite NEGATIVE Negative   WBC, UA 0-2/hpf 0-2/hpf   RBC / HPF none seen 0-2/hpf   Mucus, UA Presence of (A) None  Squamous Epithelial / HPF Rare(0-4/hpf) Rare(0-4/hpf)  TSH  Result Value Ref Range   TSH  2.53 0.35 - 5.50 uIU/mL  Magnesium  Result Value Ref Range   Magnesium 2.2 1.5 - 2.5 mg/dL  PTH, intact (no Ca)  Result Value Ref Range   PTH 161 (H) 16 - 77 pg/mL      The 10-year ASCVD risk score (Arnett DK, et al., 2019) is: 11.1%    Assessment & Plan:   Hypercalcemia -     Comprehensive metabolic panel -     Ambulatory referral to Endocrinology  Hyperparathyroidism, unspecified ([place]) -     Comprehensive metabolic panel -     TSH -     Parathyroid hormone, intact (no Ca) -     Ambulatory referral to Endocrinology  Elevated BP without diagnosis of hypertension -     CBC -     Comprehensive metabolic panel -     Urinalysis, Routine w reflex microscopic  Elevated LDL cholesterol level -     Lipid panel  Anxiety about health -     Escitalopram Oxalate; Take 1 tablet (10 mg total) by mouth daily.  Dispense: 30 tablet; Refill: 1  Muscle cramps -     Comprehensive metabolic panel -     Urinalysis, Routine w reflex microscopic -     Magnesium  Elevated glucose -     Comprehensive metabolic panel -     Hemoglobin A1c  Need for pneumococcal 20-valent conjugate vaccination -     Pneumococcal conjugate vaccine 20-valent  Hyperkalemia -     Potassium; Future    Return in about 6 weeks (around 03/02/2022).  Encouraged her to have Cologuard done.  Will start Lexapro for anxiety and hopefully her blood pressure well decrease.  Will follow-up in 6 weeks.  Agrees to have pneumococcal 20 today.  Again advised her to have the Shingrix vaccine through her pharmacy.  Checking potassium and magnesium regarding muscle cramps.  Advised her to stretch those out.  Rechecking PTH and calcium levels.  Information was given about muscle cramping.  Libby Maw, MD

## 2022-01-20 LAB — PARATHYROID HORMONE, INTACT (NO CA): PTH: 161 pg/mL — ABNORMAL HIGH (ref 16–77)

## 2022-01-23 DIAGNOSIS — Z23 Encounter for immunization: Secondary | ICD-10-CM | POA: Insufficient documentation

## 2022-01-23 NOTE — Addendum Note (Signed)
Addended by: Jon Billings on: 01/23/2022 01:13 PM   Modules accepted: Orders

## 2022-02-02 ENCOUNTER — Telehealth: Payer: Self-pay | Admitting: Family Medicine

## 2022-02-02 NOTE — Telephone Encounter (Signed)
Left message for patient to call back and schedule Medicare Annual Wellness Visit (AWV) either virtually or in office. Left  my Herbie Drape number [phone]   Last AWV 01/25/21 please schedule with Nurse Health Adviser   45 min for awv-i  in office appointments 30 min for awv-s & awv-i phone/virtual appointments

## 2022-03-01 IMAGING — NM NM PARATHYROID W/ SPECT
1 series · 6 of 6 positions shown · non-contrast
Comparison: 09/29/2019

CLINICAL DATA: Hyperparathyroidism, hypercalcemia

EXAM:
NM PARATHYROID SCINTIGRAPHY AND SPECT IMAGING
TECHNIQUE: Following intravenous administration of radiopharmaceutical, early
and 2-hour delayed planar images were obtained in the anterior
projection. Delayed triplanar SPECT images were also obtained at 2
hours.
RADIOPHARMACEUTICALS:  24.9 mCi Nc-GGm Sestamibi IV

[Series 3: spect parathyroid · 4.14mm/px · 6 of 64 frames shown]
[frame 6/64  full-range]
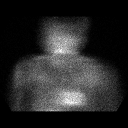
[frame 16/64  full-range]
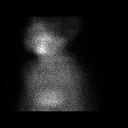
[frame 27/64  full-range]
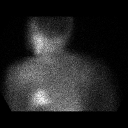
[frame 38/64  full-range]
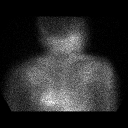
[frame 48/64  full-range]
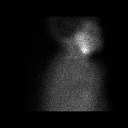
[frame 59/64  full-range]
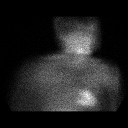

[6 of 6 positions shown; findings below may reference images not displayed]

FINDINGS: Planar imaging: Initially slightly asymmetric distribution of
sestamibi greater in RIGHT thyroid lobe than LEFT, diffusely. Good
washout of tracer from thyroid tissue on delayed image with a focal
area of abnormal sestamibi retention at the upper pole the RIGHT
thyroid lobe.

SPECT imaging: Focal area of abnormal sestamibi retention at the
upper pole the RIGHT thyroid lobe consistent with a RIGHT superior
parathyroid adenoma. This is new since the previous exam. No
abnormal tracer retention seen at the expected positions of the
remaining parathyroid glands. No ectopic localization of tracer in
this mediastinum.
IMPRESSION: Positive exam for presence of a RIGHT superior parathyroid adenoma.

## 2022-03-10 ENCOUNTER — Telehealth: Payer: Self-pay

## 2022-03-10 ENCOUNTER — Encounter: Payer: Self-pay | Admitting: Family Medicine

## 2022-03-10 ENCOUNTER — Ambulatory Visit (INDEPENDENT_AMBULATORY_CARE_PROVIDER_SITE_OTHER): Payer: PPO | Admitting: Family Medicine

## 2022-03-10 VITALS — BP 142/84 | HR 74 | Temp 97.6°F | Wt 152.8 lb

## 2022-03-10 DIAGNOSIS — E7800 Pure hypercholesterolemia, unspecified: Secondary | ICD-10-CM

## 2022-03-10 DIAGNOSIS — R030 Elevated blood-pressure reading, without diagnosis of hypertension: Secondary | ICD-10-CM

## 2022-03-10 DIAGNOSIS — E213 Hyperparathyroidism, unspecified: Secondary | ICD-10-CM

## 2022-03-10 DIAGNOSIS — E875 Hyperkalemia: Secondary | ICD-10-CM | POA: Diagnosis not present

## 2022-03-10 DIAGNOSIS — F439 Reaction to severe stress, unspecified: Secondary | ICD-10-CM

## 2022-03-10 LAB — POTASSIUM: Potassium: 5.2 mEq/L — ABNORMAL HIGH (ref 3.5–5.1)

## 2022-03-10 MED ORDER — HYDROCHLOROTHIAZIDE 25 MG PO TABS: 25.0000 mg | ORAL_TABLET | Freq: Every day | ORAL | 0 refills | Status: DC

## 2022-03-10 MED ORDER — ATORVASTATIN CALCIUM 10 MG PO TABS: 10.0000 mg | ORAL_TABLET | Freq: Every day | ORAL | 3 refills | Status: DC

## 2022-03-10 MED ORDER — ESCITALOPRAM OXALATE 5 MG PO TABS: 5.0000 mg | ORAL_TABLET | Freq: Every day | ORAL | 1 refills | Status: DC

## 2022-03-10 NOTE — Patient Outreach (Signed)
  Care Coordination   In Person Provider Office Visit Note   03/10/2022 Name: Vicki George MRN: [id number] DOB: [birth year]  Vicki George is a 67 y.o. year old female who sees Libby Maw, MD for primary care. I engaged with Vicki George in the providers office today.  What matters to the patients health and wellness today?  none    Goals Addressed             This Visit's Progress    COMPLETED: care coordination activities-no follow up required       Care Coordination Interventions: Advised patient to Annual Wellness exam. Discussed [facility] services and support. Assessed SDOH. Advised to discuss with primary care physician if services needed in the future.  Interventions Today    Flowsheet Row Most Recent Value  General Interventions   General Interventions Discussed/Reviewed General Interventions Discussed, Doctor Visits, Health Screening  Doctor Visits Discussed/Reviewed Doctor Visits Discussed, Annual Wellness Visits  Health Screening Mammogram, Colonoscopy  Education Interventions   Education Provided Provided Education  Provided Verbal Education On Other  [preventive health screenings]             SDOH assessments and interventions completed:  Yes  SDOH Interventions Today    Flowsheet Row Most Recent Value  SDOH Interventions   Housing Interventions Intervention Not Indicated  Transportation Interventions Intervention Not Indicated        Care Coordination Interventions:  Yes, provided   Follow up plan: No further intervention required.   Encounter Outcome:  Pt. Visit Completed   Jone Baseman, RN, MSN [place] [facility] Care Management Coordinator Direct Line [phone]

## 2022-03-10 NOTE — Progress Notes (Signed)
Established Patient Office Visit   Subjective:  Patient ID: Vicki George, female    DOB: [birth year]  Age: 67 y.o. MRN: [id number]  Chief Complaint  Patient presents with   Medical Management of Chronic Issues    Not taking lexapro due to feeling drowsy    HPI Encounter Diagnoses  Name Primary?   Hyperparathyroidism, unspecified ([place]) Yes   Elevated BP without diagnosis of hypertension    Stress at home    Elevated LDL cholesterol level    Hyperkalemia    For follow-up of above.  10 mg of Lexapro led to some daytime somnolence.  It was otherwise okay.  She does continue to experience some early morning awakenings.  She denies depression.  LDL mildly elevated with a mildly elevated ASCVD risk score.  Appointment with endocrinology is not till April 17.   Review of Systems  Constitutional: Negative.   HENT: Negative.    Eyes:  Negative for blurred vision, discharge and redness.  Respiratory: Negative.    Cardiovascular: Negative.   Gastrointestinal:  Negative for abdominal pain.  Genitourinary: Negative.   Musculoskeletal: Negative.  Negative for myalgias.  Skin:  Negative for rash.  Neurological:  Negative for tingling, loss of consciousness and weakness.  Endo/Heme/Allergies:  Negative for polydipsia.      03/10/2022   10:30 AM 01/19/2022   10:06 AM 04/20/2021    8:51 AM  Depression screen PHQ 2/9  Decreased Interest 0 0 0  Down, Depressed, Hopeless 0 0 0  PHQ - 2 Score 0 0 0  Altered sleeping 0    Tired, decreased energy 0    Change in appetite 0    Feeling bad or failure about yourself  0    Trouble concentrating 0    Moving slowly or fidgety/restless 0    Suicidal thoughts 0    PHQ-9 Score 0    Difficult doing work/chores Not difficult at all        Current Outpatient Medications:    atorvastatin (LIPITOR) 10 MG tablet, Take 1 tablet (10 mg total) by mouth daily., Disp: 90 tablet, Rfl: 3   escitalopram (LEXAPRO) 5 MG tablet, Take 1 tablet (5 mg total) by  mouth daily., Disp: 30 tablet, Rfl: 1   hydrochlorothiazide (HYDRODIURIL) 25 MG tablet, Take 1 tablet (25 mg total) by mouth daily., Disp: 90 tablet, Rfl: 0   ibuprofen (ADVIL) 200 MG tablet, Take 400 mg by mouth every 6 (six) hours as needed for headache., Disp: , Rfl:    Objective:     BP (!) 142/84 (BP Location: Left Arm, Patient Position: Sitting, Cuff Size: Large)   Pulse 74   Temp 97.6 F (36.4 C) (Temporal)   Wt 152 lb 12.8 oz (69.3 kg)   SpO2 99%   BMI 27.95 kg/m  BP Readings from Last 3 Encounters:  03/10/22 (!) 142/84  01/19/22 (!) 146/86  07/21/21 130/78   Wt Readings from Last 3 Encounters:  03/10/22 152 lb 12.8 oz (69.3 kg)  01/19/22 152 lb 12.8 oz (69.3 kg)  07/21/21 154 lb 12.8 oz (70.2 kg)      Physical Exam Constitutional:      General: She is not in acute distress.    Appearance: Normal appearance. She is not ill-appearing, toxic-appearing or diaphoretic.  HENT:     Head: Normocephalic and atraumatic.     Right Ear: External ear normal.     Left Ear: External ear normal.  Eyes:     General: No scleral  icterus.       Right eye: No discharge.        Left eye: No discharge.     Extraocular Movements: Extraocular movements intact.     Conjunctiva/sclera: Conjunctivae normal.  Pulmonary:     Effort: Pulmonary effort is normal. No respiratory distress.  Skin:    General: Skin is warm and dry.  Neurological:     Mental Status: She is alert and oriented to person, place, and time.  Psychiatric:        Mood and Affect: Mood normal.        Behavior: Behavior normal.      No results found for any visits on 03/10/22.    The 10-year ASCVD risk score (Arnett DK, et al., 2019) is: 10.4%    Assessment & Plan:   Hyperparathyroidism, unspecified (HCC) -     PTH, intact and calcium -     hydroCHLOROthiazide; Take 1 tablet (25 mg total) by mouth daily.  Dispense: 90 tablet; Refill: 0  Elevated BP without diagnosis of hypertension -      hydroCHLOROthiazide; Take 1 tablet (25 mg total) by mouth daily.  Dispense: 90 tablet; Refill: 0  Stress at home -     Escitalopram Oxalate; Take 1 tablet (5 mg total) by mouth daily.  Dispense: 30 tablet; Refill: 1  Elevated LDL cholesterol level -     Atorvastatin Calcium; Take 1 tablet (10 mg total) by mouth daily.  Dispense: 90 tablet; Refill: 3  Hyperkalemia -     Potassium    Return in about 5 weeks (around 04/14/2022).  Has endocrinology appointment in April.  Start HCTZ for blood pressure and to help lower calcium.  Will try lower dose of the Lexapro.  Low-dose atorvastatin for cholesterol.  Libby Maw, MD

## 2022-03-11 LAB — PTH, INTACT AND CALCIUM
Calcium: 11.2 mg/dL — ABNORMAL HIGH (ref 8.6–10.4)
PTH: 179 pg/mL — ABNORMAL HIGH (ref 16–77)

## 2022-04-12 ENCOUNTER — Telehealth: Payer: Self-pay | Admitting: Family Medicine

## 2022-04-12 NOTE — Telephone Encounter (Signed)
Contacted Vicki George to schedule their annual wellness visit. Appointment made for 04/14/22.  Vicki George AWV direct phone # [phone]

## 2022-04-13 ENCOUNTER — Telehealth: Payer: Self-pay | Admitting: Family Medicine

## 2022-04-13 NOTE — Telephone Encounter (Signed)
Pt needs her appt to be a telephone call or will have a conflict with her next appt. I changed it but not sure I chose the right code.

## 2022-04-13 NOTE — Telephone Encounter (Signed)
So sorry pt called right back and stated she needs the appt changed to Monday the 15th. She is worried she will not make it to her next appt.

## 2022-04-14 ENCOUNTER — Ambulatory Visit (INDEPENDENT_AMBULATORY_CARE_PROVIDER_SITE_OTHER): Payer: PPO | Admitting: Family Medicine

## 2022-04-14 ENCOUNTER — Ambulatory Visit (INDEPENDENT_AMBULATORY_CARE_PROVIDER_SITE_OTHER): Payer: PPO

## 2022-04-14 ENCOUNTER — Encounter: Payer: Self-pay | Admitting: Family Medicine

## 2022-04-14 ENCOUNTER — Telehealth: Payer: PPO

## 2022-04-14 VITALS — Ht 62.0 in | Wt 152.0 lb

## 2022-04-14 VITALS — BP 138/84 | HR 85 | Temp 97.8°F | Resp 16 | Ht 62.0 in | Wt 152.0 lb

## 2022-04-14 DIAGNOSIS — E213 Hyperparathyroidism, unspecified: Secondary | ICD-10-CM

## 2022-04-14 DIAGNOSIS — E559 Vitamin D deficiency, unspecified: Secondary | ICD-10-CM | POA: Diagnosis not present

## 2022-04-14 DIAGNOSIS — G4709 Other insomnia: Secondary | ICD-10-CM | POA: Diagnosis not present

## 2022-04-14 DIAGNOSIS — Z0000 Encounter for general adult medical examination without abnormal findings: Secondary | ICD-10-CM | POA: Diagnosis not present

## 2022-04-14 DIAGNOSIS — Z9889 Other specified postprocedural states: Secondary | ICD-10-CM | POA: Diagnosis not present

## 2022-04-14 DIAGNOSIS — E875 Hyperkalemia: Secondary | ICD-10-CM

## 2022-04-14 DIAGNOSIS — Z9089 Acquired absence of other organs: Secondary | ICD-10-CM

## 2022-04-14 LAB — COMPREHENSIVE METABOLIC PANEL
ALT: 19 U/L (ref 0–35)
AST: 23 U/L (ref 0–37)
Albumin: 4.3 g/dL (ref 3.5–5.2)
Alkaline Phosphatase: 117 U/L (ref 39–117)
BUN: 13 mg/dL (ref 6–23)
CO2: 31 mEq/L (ref 19–32)
Calcium: 11.2 mg/dL — ABNORMAL HIGH (ref 8.4–10.5)
Chloride: 102 mEq/L (ref 96–112)
Creatinine, Ser: 0.68 mg/dL (ref 0.40–1.20)
GFR: 90.12 mL/min (ref >60.00)
Glucose, Bld: 108 mg/dL — ABNORMAL HIGH (ref 70–99)
Potassium: 4.2 mEq/L (ref 3.5–5.1)
Sodium: 140 mEq/L (ref 135–145)
Total Bilirubin: 0.8 mg/dL (ref 0.2–1.2)
Total Protein: 7.3 g/dL (ref 6.0–8.3)

## 2022-04-14 LAB — VITAMIN D 25 HYDROXY (VIT D DEFICIENCY, FRACTURES): VITD: 33.34 ng/mL (ref 30.00–100.00)

## 2022-04-14 NOTE — Patient Instructions (Signed)
Vicki George , Thank you for taking time to come for your Medicare Wellness Visit. I appreciate your ongoing commitment to your health goals. Please review the following plan we discussed and let me know if I can assist you in the future.   These are the goals we discussed:  Goals      Patient Stated     04/14/2022, wants to get calcium under control        This is a list of the screening recommended for you and due dates:  Health Maintenance  Topic Date Due   Cologuard (Stool DNA test)  12/11/2020   Zoster (Shingles) Vaccine (1 of 2) 04/20/2023*   Mammogram  01/12/2023   Medicare Annual Wellness Visit  04/14/2023   DTaP/Tdap/Td vaccine (2 - Td or Tdap) 04/21/2031   Pneumonia Vaccine  Completed   DEXA scan (bone density measurement)  Completed   Hepatitis C Screening: USPSTF Recommendation to screen - Ages 26-79 yo.  Completed   HPV Vaccine  Aged Out   Flu Shot  Discontinued   COVID-19 Vaccine  Discontinued  *Topic was postponed. The date shown is not the original due date.    Advanced directives: Advance directive discussed with you today.    Conditions/risks identified: none  Next appointment: Follow up in one year for your annual wellness visit    Preventive Care 65 Years and Older, Female Preventive care refers to lifestyle choices and visits with your health care provider that can promote health and wellness. What does preventive care include? A yearly physical exam. This is also called an annual well check. Dental exams once or twice a year. Routine eye exams. Ask your health care provider how often you should have your eyes checked. Personal lifestyle choices, including: Daily care of your teeth and gums. Regular physical activity. Eating a healthy diet. Avoiding tobacco and drug use. Limiting alcohol use. Practicing safe sex. Taking low-dose aspirin every day. Taking vitamin and mineral supplements as recommended by your health care provider. What happens  during an annual well check? The services and screenings done by your health care provider during your annual well check will depend on your age, overall health, lifestyle risk factors, and family history of disease. Counseling  Your health care provider may ask you questions about your: Alcohol use. Tobacco use. Drug use. Emotional well-being. Home and relationship well-being. Sexual activity. Eating habits. History of falls. Memory and ability to understand (cognition). Work and work Astronomer. Reproductive health. Screening  You may have the following tests or measurements: Height, weight, and BMI. Blood pressure. Lipid and cholesterol levels. These may be checked every 5 years, or more frequently if you are over 52 years old. Skin check. Lung cancer screening. You may have this screening every year starting at age 65 if you have a 30-pack-year history of smoking and currently smoke or have quit within the past 15 years. Fecal occult blood test (FOBT) of the stool. You may have this test every year starting at age 34. Flexible sigmoidoscopy or colonoscopy. You may have a sigmoidoscopy every 5 years or a colonoscopy every 10 years starting at age 67. Hepatitis C blood test. Hepatitis B blood test. Sexually transmitted disease (STD) testing. Diabetes screening. This is done by checking your blood sugar (glucose) after you have not eaten for a while (fasting). You may have this done every 1-3 years. Bone density scan. This is done to screen for osteoporosis. You may have this done starting at age 43. Mammogram.  This may be done every 1-2 years. Talk to your health care provider about how often you should have regular mammograms. Talk with your health care provider about your test results, treatment options, and if necessary, the need for more tests. Vaccines  Your health care provider may recommend certain vaccines, such as: Influenza vaccine. This is recommended every  year. Tetanus, diphtheria, and acellular pertussis (Tdap, Td) vaccine. You may need a Td booster every 10 years. Zoster vaccine. You may need this after age 69. Pneumococcal 13-valent conjugate (PCV13) vaccine. One dose is recommended after age 37. Pneumococcal polysaccharide (PPSV23) vaccine. One dose is recommended after age 26. Talk to your health care provider about which screenings and vaccines you need and how often you need them. This information is not intended to replace advice given to you by your health care provider. Make sure you discuss any questions you have with your health care provider. Document Released: 01/15/2015 Document Revised: 09/08/2015 Document Reviewed: 10/20/2014 Elsevier Interactive Patient Education  2017 [place] Prevention in the Home Falls can cause injuries. They can happen to people of all ages. There are many things you can do to make your home safe and to help prevent falls. What can I do on the outside of my home? Regularly fix the edges of walkways and driveways and fix any cracks. Remove anything that might make you trip as you walk through a door, such as a raised step or threshold. Trim any bushes or trees on the path to your home. Use bright outdoor lighting. Clear any walking paths of anything that might make someone trip, such as rocks or tools. Regularly check to see if handrails are loose or broken. Make sure that both sides of any steps have handrails. Any raised decks and porches should have guardrails on the edges. Have any leaves, snow, or ice cleared regularly. Use sand or salt on walking paths during winter. Clean up any spills in your garage right away. This includes oil or grease spills. What can I do in the bathroom? Use night lights. Install grab bars by the toilet and in the tub and shower. Do not use towel bars as grab bars. Use non-skid mats or decals in the tub or shower. If you need to sit down in the shower, use a  plastic, non-slip stool. Keep the floor dry. Clean up any water that spills on the floor as soon as it happens. Remove soap buildup in the tub or shower regularly. Attach bath mats securely with double-sided non-slip rug tape. Do not have throw rugs and other things on the floor that can make you trip. What can I do in the bedroom? Use night lights. Make sure that you have a light by your bed that is easy to reach. Do not use any sheets or blankets that are too big for your bed. They should not hang down onto the floor. Have a firm chair that has side arms. You can use this for support while you get dressed. Do not have throw rugs and other things on the floor that can make you trip. What can I do in the kitchen? Clean up any spills right away. Avoid walking on wet floors. Keep items that you use a lot in easy-to-reach places. If you need to reach something above you, use a strong step stool that has a grab bar. Keep electrical cords out of the way. Do not use floor polish or wax that makes floors slippery. If you  must use wax, use non-skid floor wax. Do not have throw rugs and other things on the floor that can make you trip. What can I do with my stairs? Do not leave any items on the stairs. Make sure that there are handrails on both sides of the stairs and use them. Fix handrails that are broken or loose. Make sure that handrails are as long as the stairways. Check any carpeting to make sure that it is firmly attached to the stairs. Fix any carpet that is loose or worn. Avoid having throw rugs at the top or bottom of the stairs. If you do have throw rugs, attach them to the floor with carpet tape. Make sure that you have a light switch at the top of the stairs and the bottom of the stairs. If you do not have them, ask someone to add them for you. What else can I do to help prevent falls? Wear shoes that: Do not have high heels. Have rubber bottoms. Are comfortable and fit you  well. Are closed at the toe. Do not wear sandals. If you use a stepladder: Make sure that it is fully opened. Do not climb a closed stepladder. Make sure that both sides of the stepladder are locked into place. Ask someone to hold it for you, if possible. Clearly mark and make sure that you can see: Any grab bars or handrails. First and last steps. Where the edge of each step is. Use tools that help you move around (mobility aids) if they are needed. These include: Canes. Walkers. Scooters. Crutches. Turn on the lights when you go into a dark area. Replace any light bulbs as soon as they burn out. Set up your furniture so you have a clear path. Avoid moving your furniture around. If any of your floors are uneven, fix them. If there are any pets around you, be aware of where they are. Review your medicines with your doctor. Some medicines can make you feel dizzy. This can increase your chance of falling. Ask your doctor what other things that you can do to help prevent falls. This information is not intended to replace advice given to you by your health care provider. Make sure you discuss any questions you have with your health care provider. Document Released: 10/15/2008 Document Revised: 05/27/2015 Document Reviewed: 01/23/2014 Elsevier Interactive Patient Education  2017 [employer].

## 2022-04-14 NOTE — Telephone Encounter (Signed)
Spoke to patient to r/s her awv appt   she r/s to 4/12 @ 345

## 2022-04-14 NOTE — Progress Notes (Signed)
Established Patient Office Visit   Subjective:  Patient ID: Vicki George, female    DOB: July 21, 1955  Age: 67 y.o. MRN: [id number]  Chief Complaint  Patient presents with   Hyperparathyroidism   Elevated BP without diagnosis of hypertension    HCTZ 25 mg   Hyperkalemia    HCTZ 25 mg   Elevated LDL cholesterol level    Started Lipitor 10 mg at last visit    Stress    Lexapro 5 mg, has not noticed much improvement     HPI Encounter Diagnoses  Name Primary?   Hyperparathyroidism, unspecified Yes   Hyperkalemia    Other insomnia    S/P parathyroidectomy    Vitamin D deficiency    Blood pressure improved with HCTZ.  Developed somnolence with the low-dose Lexapro and discontinued after 3 days.  Has appointment with endocrinology on Wednesday.   Review of Systems  Constitutional: Negative.   HENT: Negative.    Eyes:  Negative for blurred vision, discharge and redness.  Respiratory: Negative.    Cardiovascular: Negative.   Gastrointestinal:  Negative for abdominal pain.  Genitourinary: Negative.   Musculoskeletal: Negative.  Negative for myalgias.  Skin:  Negative for rash.  Neurological:  Negative for tingling, loss of consciousness and weakness.  Endo/Heme/Allergies:  Negative for polydipsia.     Current Outpatient Medications:    atorvastatin (LIPITOR) 10 MG tablet, Take 1 tablet (10 mg total) by mouth daily., Disp: 90 tablet, Rfl: 3   escitalopram (LEXAPRO) 5 MG tablet, Take 1 tablet (5 mg total) by mouth daily., Disp: 30 tablet, Rfl: 1   hydrochlorothiazide (HYDRODIURIL) 25 MG tablet, Take 1 tablet (25 mg total) by mouth daily., Disp: 90 tablet, Rfl: 0   ibuprofen (ADVIL) 200 MG tablet, Take 400 mg by mouth every 6 (six) hours as needed for headache., Disp: , Rfl:    Objective:     BP 138/84   Pulse 85   Temp 97.8 F (36.6 C) (Temporal)   Resp 16   Ht  (1.575 m)   Wt 152 lb (68.9 kg)   SpO2 97%   BMI 27.80 kg/m    Physical Exam Constitutional:       General: She is not in acute distress.    Appearance: Normal appearance. She is not ill-appearing, toxic-appearing or diaphoretic.  HENT:     Head: Normocephalic and atraumatic.     Right Ear: External ear normal.     Left Ear: External ear normal.  Eyes:     General: No scleral icterus.       Right eye: No discharge.        Left eye: No discharge.     Extraocular Movements: Extraocular movements intact.     Conjunctiva/sclera: Conjunctivae normal.  Pulmonary:     Effort: Pulmonary effort is normal. No respiratory distress.  Skin:    General: Skin is warm and dry.  Neurological:     Mental Status: She is alert and oriented to person, place, and time.  Psychiatric:        Mood and Affect: Mood normal.        Behavior: Behavior normal.      No results found for any visits on 04/14/22.    The 10-year ASCVD risk score (Arnett DK, et al., 2019) is: 9.8%    Assessment & Plan:   Hyperparathyroidism, unspecified -     Comprehensive metabolic panel  Hyperkalemia -     Comprehensive metabolic panel  Other insomnia  S/P parathyroidectomy  Vitamin D deficiency -     VITAMIN D 25 Hydroxy (Vit-D Deficiency, Fractures)    Return in about 8 weeks (around 06/09/2022).  Endocrinology appointment on Wednesday.  Please start a multivitamin that has vitamin D.  The chronology may adjust that.  Please restart Lexapro and give it at least 2 weeks for the daytime somnolence to clear.  Rechecking potassium.  Continue HCTZ.  Mliss Sax, MD

## 2022-04-14 NOTE — Progress Notes (Signed)
I connected with  Mechele Claude on 04/14/22 by a audio enabled telemedicine application and verified that I am speaking with the correct person using two identifiers.  Patient Location: Home  Provider Location: Office/Clinic  I discussed the limitations of evaluation and management by telemedicine. The patient expressed understanding and agreed to proceed.  Subjective:   Vicki George is a 67 y.o. female who presents for Medicare Annual (Subsequent) preventive examination.  Review of Systems     Cardiac Risk Factors include: advanced age (>110men, >22 women)     Objective:    Today's Vitals   04/14/22 1541  Weight: 152 lb (68.9 kg)  Height: 5\' 2"  (1.575 m)   Body mass index is 27.8 kg/m.     04/14/2022    3:44 PM 02/15/2021    1:31 PM 02/11/2021    9:06 AM 01/25/2021   10:34 AM 11/13/2019    9:00 PM 11/10/2019    9:54 AM  Advanced Directives  Does Patient Have a Medical Advance Directive? Yes No No No No No  Type of Advance Directive Healthcare Power of Attorney       Would patient like information on creating a medical advance directive?  No - Patient declined Yes (MAU/Ambulatory/Procedural Areas - Information given) No - Patient declined No - Patient declined     Current Medications (verified) Outpatient Encounter Medications as of 04/14/2022  Medication Sig   atorvastatin (LIPITOR) 10 MG tablet Take 1 tablet (10 mg total) by mouth daily.   escitalopram (LEXAPRO) 5 MG tablet Take 1 tablet (5 mg total) by mouth daily.   hydrochlorothiazide (HYDRODIURIL) 25 MG tablet Take 1 tablet (25 mg total) by mouth daily.   ibuprofen (ADVIL) 200 MG tablet Take 400 mg by mouth every 6 (six) hours as needed for headache.   No facility-administered encounter medications on file as of 04/14/2022.    Allergies (verified) Aspirin and Latex   History: Past Medical History:  Diagnosis Date   Chicken pox    Depression    Urinary tract infection    Past Surgical History:  Procedure  Laterality Date   EYE SURGERY Bilateral 10/2020   MYOMECTOMY     PARATHYROIDECTOMY Left 11/13/2019   Procedure: LEFT PARATHYROIDECTOMY;  Surgeon: Axel Filler, MD;  Location: [facility] OR;  Service: General;  Laterality: Left;   PARATHYROIDECTOMY Right 02/15/2021   Procedure: RIGHT PARATHYROIDECTOMY;  Surgeon: Axel Filler, MD;  Location[facility] OR;  Service: General;  Laterality: Right;   Family History  Problem Relation Age of Onset   Hypercalcemia Neg Hx    Social History   Socioeconomic History   Marital status: Widowed    Spouse name: Not on file   Number of children: Not on file   Years of education: Not on file   Highest education level: Not on file  Occupational History   Not on file  Tobacco Use   Smoking status: Never   Smokeless tobacco: Never  Vaping Use   Vaping Use: Never used  Substance and Sexual Activity   Alcohol use: No   Drug use: No   Sexual activity: Not Currently  Other Topics Concern   Not on file  Social History Narrative   Not on file   Social Determinants of Health   Financial Resource Strain: Low Risk  (04/14/2022)   Overall Financial Resource Strain (CARDIA)    Difficulty of Paying Living Expenses: Not hard at all  Food Insecurity: No Food Insecurity (04/14/2022)   Hunger Vital Sign  Worried About Programme researcher, broadcasting/film/video in the Last Year: Never true    Ran Out of Food in the Last Year: Never true  Transportation Needs: No Transportation Needs (04/14/2022)   PRAPARE - Administrator, Civil Service (Medical): No    Lack of Transportation (Non-Medical): No  Physical Activity: Sufficiently Active (04/14/2022)   Exercise Vital Sign    Days of Exercise per Week: 7 days    Minutes of Exercise per Session: 60 min  Stress: No Stress Concern Present (04/14/2022)   [employer] of Occupational Health - Occupational Stress Questionnaire    Feeling of Stress : Not at all  Social Connections: Moderately Isolated (01/25/2021)   Social  Connection and Isolation Panel [NHANES]    Frequency of Communication with Friends and Family: Twice a week    Frequency of Social Gatherings with Friends and Family: Twice a week    Attends Religious Services: More than 4 times per year    Active Member of [employer] or Organizations: No    Attends Banker Meetings: Never    Marital Status: Widowed    Tobacco Counseling Counseling given: Not Answered   Clinical Intake:  Pre-visit preparation completed: Yes  Pain : No/denies pain     Nutritional Status: BMI 25 -29 Overweight Nutritional Risks: None Diabetes: No  How often do you need to have someone help you when you read instructions, pamphlets, or other written materials from your doctor or pharmacy?: 1 - Never  Diabetic? no  Interpreter Needed?: No  Information entered by :: NAllen LPN   Activities of Daily Living    04/14/2022    3:45 PM  In your present state of health, do you have any difficulty performing the following activities:  Hearing? 0  Vision? 0  Difficulty concentrating or making decisions? 0  Walking or climbing stairs? 0  Dressing or bathing? 0  Doing errands, shopping? 0  Preparing Food and eating ? N  Using the Toilet? N  In the past six months, have you accidently leaked urine? N  Do you have problems with loss of bowel control? N  Managing your Medications? N  Managing your Finances? N  Housekeeping or managing your Housekeeping? N    Patient Care Team: Mliss Sax, MD as PCP - General (Family Medicine)  Indicate any recent Medical Services you may have received from other than [facility] providers in the past year (date may be approximate).     Assessment:   This is a routine wellness examination for [facility].  Hearing/Vision screen Vision Screening - Comments:: Regular eye exams, Dr. Cathey Endow, Maggie Schwalbe  Dietary issues and exercise activities discussed: Current Exercise Habits: Home exercise routine, Type of  exercise: walking, Time (Minutes): 60, Frequency (Times/Week): 7, Weekly Exercise (Minutes/Week): 420   Goals Addressed             This Visit's Progress    Patient Stated       04/14/2022, wants to get calcium under control       Depression Screen    04/14/2022    3:45 PM 03/10/2022   10:44 AM 03/10/2022   10:30 AM 01/19/2022   10:06 AM 04/20/2021    8:51 AM 01/26/2021    9:43 AM 01/26/2021    8:45 AM  PHQ 2/9 Scores  PHQ - 2 Score 0 0 0 0 0 0 0  PHQ- 9 Score   0   1     Fall Risk  04/14/2022    3:45 PM 01/19/2022   10:06 AM 04/20/2021    8:51 AM 01/26/2021    8:45 AM 01/25/2021   10:34 AM  Fall Risk   Falls in the past year? 0 0 0 0 0  Number falls in past yr: 0 0 0 0 0  Injury with Fall? 0 0   0  Risk for fall due to : Medication side effect No Fall Risks   No Fall Risks  Follow up Falls prevention discussed;Education provided;Falls evaluation completed Falls evaluation completed   Falls evaluation completed    FALL RISK PREVENTION PERTAINING TO THE HOME:  Any stairs in or around the home? Yes  If so, are there any without handrails? Yes  Home free of loose throw rugs in walkways, pet beds, electrical cords, etc? Yes  Adequate lighting in your home to reduce risk of falls? Yes   ASSISTIVE DEVICES UTILIZED TO PREVENT FALLS:  Life alert? No  Use of a cane, walker or w/c? No  Grab bars in the bathroom? No  Shower chair or bench in shower? No  Elevated toilet seat or a handicapped toilet? Yes   TIMED UP AND GO:  Was the test performed? No .      Cognitive Function:        04/14/2022    3:46 PM  6CIT Screen  What Year? 0 points  What month? 0 points  What time? 0 points  Count back from 20 0 points  Months in reverse 0 points  Repeat phrase 0 points  Total Score 0 points    Immunizations Immunization History  Administered Date(s) Administered   Influenza,inj,Quad PF,6+ Mos 12/13/2016   PFIZER(Purple Top)SARS-COV-2 Vaccination 03/31/2019,  04/23/2019, 01/09/2020   PNEUMOCOCCAL CONJUGATE-20 01/19/2022   Tdap 04/20/2021    TDAP status: Up to date  Flu Vaccine status: Up to date  Pneumococcal vaccine status: Up to date  Covid-19 vaccine status: Completed vaccines  Qualifies for Shingles Vaccine? Yes   Zostavax completed No   Shingrix Completed?: No.    Education has been provided regarding the importance of this vaccine. Patient has been advised to call insurance company to determine out of pocket expense if they have not yet received this vaccine. Advised may also receive vaccine at local pharmacy or Health Dept. Verbalized acceptance and understanding.  Screening Tests Health Maintenance  Topic Date Due   Fecal DNA (Cologuard)  12/11/2020   Medicare Annual Wellness (AWV)  01/25/2022   Zoster Vaccines- Shingrix (1 of 2) 04/20/2023 (Originally 11/12/2004)   MAMMOGRAM  01/12/2023   DTaP/Tdap/Td (2 - Td or Tdap) 04/21/2031   Pneumonia Vaccine 66+ Years old  Completed   DEXA SCAN  Completed   Hepatitis C Screening  Completed   HPV VACCINES  Aged Out   INFLUENZA VACCINE  Discontinued   COVID-19 Vaccine  Discontinued    Health Maintenance  Health Maintenance Due  Topic Date Due   Fecal DNA (Cologuard)  12/11/2020   Medicare Annual Wellness (AWV)  01/25/2022    Colorectal cancer screening: Type of screening: Cologuard. Completed 12/11/2017. Repeat every 3 years  Mammogram status: Completed 01/11/2022. Repeat every year  Bone Density status: Completed 06/16/2019.   Lung Cancer Screening: (Low Dose CT Chest recommended if Age 32-80 years, 30 pack-year currently smoking OR have quit w/in 15years.) does not qualify.   Lung Cancer Screening Referral: no  Additional Screening:  Hepatitis C Screening: does qualify; Completed 12/13/2016  Vision Screening: Recommended annual ophthalmology exams for  early detection of glaucoma and other disorders of the eye. Is the patient up to date with their annual eye exam?  Yes   Who is the provider or what is the name of the office in which the patient attends annual eye exams? Dr. Cathey Endow If pt is not established with a provider, would they like to be referred to a provider to establish care? No .   Dental Screening: Recommended annual dental exams for proper oral hygiene  Community Resource Referral / Chronic Care Management: CRR required this visit?  No   CCM required this visit?  No      Plan:     I have personally reviewed and noted the following in the patient's chart:   Medical and social history Use of alcohol, tobacco or illicit drugs  Current medications and supplements including opioid prescriptions. Patient is not currently taking opioid prescriptions. Functional ability and status Nutritional status Physical activity Advanced directives List of other physicians Hospitalizations, surgeries, and ER visits in previous 12 months Vitals Screenings to include cognitive, depression, and falls Referrals and appointments  In addition, I have reviewed and discussed with patient certain preventive protocols, quality metrics, and best practice recommendations. A written personalized care plan for preventive services as well as general preventive health recommendations were provided to patient.     Barb Merino, LPN   04/10/8117   Nurse Notes: none  Due to this being a virtual visit, the after visit summary with patients personalized plan was offered to patient via mail or my-chart. Patient would like to access on my-chart

## 2022-04-19 DIAGNOSIS — R030 Elevated blood-pressure reading, without diagnosis of hypertension: Secondary | ICD-10-CM | POA: Diagnosis not present

## 2022-04-19 DIAGNOSIS — R7309 Other abnormal glucose: Secondary | ICD-10-CM | POA: Diagnosis not present

## 2022-04-19 DIAGNOSIS — E210 Primary hyperparathyroidism: Secondary | ICD-10-CM | POA: Diagnosis not present

## 2022-05-30 DIAGNOSIS — R030 Elevated blood-pressure reading, without diagnosis of hypertension: Secondary | ICD-10-CM | POA: Diagnosis not present

## 2022-05-30 DIAGNOSIS — R7303 Prediabetes: Secondary | ICD-10-CM | POA: Diagnosis not present

## 2022-05-30 DIAGNOSIS — M62838 Other muscle spasm: Secondary | ICD-10-CM | POA: Diagnosis not present

## 2022-05-30 DIAGNOSIS — E559 Vitamin D deficiency, unspecified: Secondary | ICD-10-CM | POA: Diagnosis not present

## 2022-05-30 DIAGNOSIS — E210 Primary hyperparathyroidism: Secondary | ICD-10-CM | POA: Diagnosis not present

## 2022-05-30 DIAGNOSIS — E7800 Pure hypercholesterolemia, unspecified: Secondary | ICD-10-CM | POA: Diagnosis not present

## 2022-06-12 ENCOUNTER — Encounter: Payer: Self-pay | Admitting: Family Medicine

## 2022-06-12 ENCOUNTER — Ambulatory Visit (INDEPENDENT_AMBULATORY_CARE_PROVIDER_SITE_OTHER): Payer: PPO | Admitting: Family Medicine

## 2022-06-12 VITALS — BP 130/76 | HR 84 | Temp 97.9°F | Ht 62.0 in | Wt 150.0 lb

## 2022-06-12 DIAGNOSIS — R7303 Prediabetes: Secondary | ICD-10-CM | POA: Diagnosis not present

## 2022-06-12 DIAGNOSIS — Z1211 Encounter for screening for malignant neoplasm of colon: Secondary | ICD-10-CM

## 2022-06-12 DIAGNOSIS — E7800 Pure hypercholesterolemia, unspecified: Secondary | ICD-10-CM

## 2022-06-12 LAB — LIPID PANEL
Cholesterol: 106 mg/dL (ref 0–200)
HDL: 48.9 mg/dL (ref >39.00)
LDL Cholesterol: 45 mg/dL (ref 0–99)
NonHDL: 57.56
Total CHOL/HDL Ratio: 2
Triglycerides: 61 mg/dL (ref 0.0–149.0)
VLDL: 12.2 mg/dL (ref 0.0–40.0)

## 2022-06-12 LAB — URINALYSIS, ROUTINE W REFLEX MICROSCOPIC
Bilirubin Urine: NEGATIVE
Hgb urine dipstick: NEGATIVE
Ketones, ur: NEGATIVE
Leukocytes,Ua: NEGATIVE
Nitrite: NEGATIVE
Specific Gravity, Urine: 1.02 (ref 1.000–1.030)
Total Protein, Urine: NEGATIVE
Urine Glucose: NEGATIVE
Urobilinogen, UA: 0.2 (ref 0.0–1.0)
pH: 7 (ref 5.0–8.0)

## 2022-06-12 LAB — BASIC METABOLIC PANEL
BUN: 16 mg/dL (ref 6–23)
CO2: 27 mEq/L (ref 19–32)
Calcium: 11.1 mg/dL — ABNORMAL HIGH (ref 8.4–10.5)
Chloride: 106 mEq/L (ref 96–112)
Creatinine, Ser: 0.72 mg/dL (ref 0.40–1.20)
GFR: 86.42 mL/min (ref >60.00)
Glucose, Bld: 102 mg/dL — ABNORMAL HIGH (ref 70–99)
Potassium: 5 mEq/L (ref 3.5–5.1)
Sodium: 140 mEq/L (ref 135–145)

## 2022-06-12 LAB — HEMOGLOBIN A1C: Hgb A1c MFr Bld: 6 % (ref 4.6–6.5)

## 2022-06-12 NOTE — Progress Notes (Addendum)
Established Patient Office Visit   Subjective:  Patient ID: Vicki George, female    DOB: June 07, 1955  Age: 67 y.o. MRN: [id number]  Chief Complaint  Patient presents with   Medical Management of Chronic Issues    8 week follow up, per pt endocrinologist took her off Hydrchlorothiazide x 2 months. Patient fasting.     HPI Encounter Diagnoses  Name Primary?   Elevated LDL cholesterol level Yes   Screen for colon cancer    Prediabetes    Follow-up of above.  Decided that Lexapro was not for her.  Too much daytime grogginess.  She is down to taking melatonin.  Continues with atorvastatin without issue.  Would like to go for colonoscopy instead of the Cologuard.  Has been exercising and losing weight.  Endocrinology discontinued HCTZ for partial treatment of hypercalcemia.  Patient has been exercising losing weight and avoiding sodium.  Blood pressure has been looking great recently.   Review of Systems  Constitutional: Negative.   HENT: Negative.    Eyes:  Negative for blurred vision, discharge and redness.  Respiratory: Negative.    Cardiovascular: Negative.   Gastrointestinal:  Negative for abdominal pain.  Genitourinary: Negative.   Musculoskeletal: Negative.  Negative for myalgias.  Skin:  Negative for rash.  Neurological:  Negative for tingling, loss of consciousness and weakness.  Endo/Heme/Allergies:  Negative for polydipsia.      06/12/2022    8:07 AM 04/14/2022    3:45 PM 03/10/2022   10:44 AM  Depression screen PHQ 2/9  Decreased Interest 0 0 0  Down, Depressed, Hopeless 0 0 0  PHQ - 2 Score 0 0 0       Current Outpatient Medications:    atorvastatin (LIPITOR) 10 MG tablet, Take 1 tablet (10 mg total) by mouth daily., Disp: 90 tablet, Rfl: 3   ibuprofen (ADVIL) 200 MG tablet, Take 400 mg by mouth every 6 (six) hours as needed for headache., Disp: , Rfl:    Objective:     BP 130/76 (BP Location: Left Arm, Patient Position: Sitting, Cuff Size: Normal)   Pulse  84   Temp 97.9 F (36.6 C) (Temporal)   Ht 5\' 2"  (1.575 m)   Wt 150 lb (68 kg)   SpO2 99%   BMI 27.44 kg/m  BP Readings from Last 3 Encounters:  06/12/22 130/76  04/14/22 138/84  03/10/22 (!) 142/84   Wt Readings from Last 3 Encounters:  06/12/22 150 lb (68 kg)  04/14/22 152 lb (68.9 kg)  04/14/22 152 lb (68.9 kg)      Physical Exam Constitutional:      General: She is not in acute distress.    Appearance: Normal appearance. She is not ill-appearing, toxic-appearing or diaphoretic.  HENT:     Head: Normocephalic and atraumatic.     Right Ear: External ear normal.     Left Ear: External ear normal.  Eyes:     General: No scleral icterus.       Right eye: No discharge.        Left eye: No discharge.     Extraocular Movements: Extraocular movements intact.     Conjunctiva/sclera: Conjunctivae normal.  Pulmonary:     Effort: Pulmonary effort is normal. No respiratory distress.  Skin:    General: Skin is warm and dry.  Neurological:     Mental Status: She is alert and oriented to person, place, and time.  Psychiatric:        Mood and Affect:  Mood normal.        Behavior: Behavior normal.      No results found for any visits on 06/12/22.    The 10-year ASCVD risk score (Arnett DK, et al., 2019) is: 8.7%    Assessment & Plan:   Elevated LDL cholesterol level -     Basic metabolic panel -     Lipid panel  Screen for colon cancer -     Ambulatory referral to Gastroenterology  Prediabetes -     Basic metabolic panel -     Hemoglobin A1c -     Urinalysis, Routine w reflex microscopic    Return in about 3 months (around 09/12/2022).  Agrees to go for colon cancer screening. Exercising and weight loss encouraged.  Information was given on prediabetes and colon cancer continue low-dose atorvastatin for elevated LDL.  Mliss Sax, MD

## 2022-06-15 DIAGNOSIS — M8588 Other specified disorders of bone density and structure, other site: Secondary | ICD-10-CM | POA: Diagnosis not present

## 2022-06-15 DIAGNOSIS — E042 Nontoxic multinodular goiter: Secondary | ICD-10-CM | POA: Diagnosis not present

## 2022-06-15 DIAGNOSIS — E213 Hyperparathyroidism, unspecified: Secondary | ICD-10-CM | POA: Diagnosis not present

## 2022-06-15 DIAGNOSIS — E210 Primary hyperparathyroidism: Secondary | ICD-10-CM | POA: Diagnosis not present

## 2022-06-20 DIAGNOSIS — E049 Nontoxic goiter, unspecified: Secondary | ICD-10-CM | POA: Diagnosis not present

## 2022-06-20 DIAGNOSIS — M8580 Other specified disorders of bone density and structure, unspecified site: Secondary | ICD-10-CM | POA: Diagnosis not present

## 2022-06-20 DIAGNOSIS — D351 Benign neoplasm of parathyroid gland: Secondary | ICD-10-CM | POA: Diagnosis not present

## 2022-06-20 DIAGNOSIS — R590 Localized enlarged lymph nodes: Secondary | ICD-10-CM | POA: Diagnosis not present

## 2022-06-20 DIAGNOSIS — E210 Primary hyperparathyroidism: Secondary | ICD-10-CM | POA: Diagnosis not present

## 2022-07-05 ENCOUNTER — Encounter: Payer: Self-pay | Admitting: Gastroenterology

## 2022-07-20 ENCOUNTER — Encounter: Payer: Self-pay | Admitting: Gastroenterology

## 2022-07-20 ENCOUNTER — Ambulatory Visit: Payer: PPO | Admitting: *Deleted

## 2022-07-20 VITALS — Ht 62.0 in | Wt 150.0 lb

## 2022-07-20 DIAGNOSIS — R195 Other fecal abnormalities: Secondary | ICD-10-CM

## 2022-07-20 MED ORDER — PEG 3350-KCL-NA BICARB-NACL 420 G PO SOLR: 4000.0000 mL | Freq: Once | ORAL | 0 refills | Status: AC

## 2022-07-20 NOTE — Progress Notes (Signed)
Pre visit completed over telephone.  Instructions mailed.   No egg or soy allergy known to patient  No issues known to pt with past sedation with any surgeries or procedures Patient denies ever being told they had issues or difficulty with intubation  No FH of Malignant Hyperthermia Pt is not on diet pills Pt is not on  home 02  Pt is not on blood thinners  Pt denies issues with constipation  No A fib or A flutter Have any cardiac testing pending--no Pt instructed to use Singlecare.com or GoodRx for a price reduction on prep

## 2022-07-23 ENCOUNTER — Encounter: Payer: Self-pay | Admitting: Certified Registered Nurse Anesthetist

## 2022-07-25 ENCOUNTER — Encounter: Payer: Self-pay | Admitting: Gastroenterology

## 2022-07-25 ENCOUNTER — Ambulatory Visit (AMBULATORY_SURGERY_CENTER): Payer: PPO | Admitting: Gastroenterology

## 2022-07-25 VITALS — BP 122/73 | HR 74 | Temp 97.3°F | Resp 13 | Ht 62.0 in | Wt 150.0 lb

## 2022-07-25 DIAGNOSIS — D122 Benign neoplasm of ascending colon: Secondary | ICD-10-CM | POA: Diagnosis not present

## 2022-07-25 DIAGNOSIS — Z1211 Encounter for screening for malignant neoplasm of colon: Secondary | ICD-10-CM | POA: Diagnosis not present

## 2022-07-25 DIAGNOSIS — F32A Depression, unspecified: Secondary | ICD-10-CM | POA: Diagnosis not present

## 2022-07-25 DIAGNOSIS — R195 Other fecal abnormalities: Secondary | ICD-10-CM

## 2022-07-25 DIAGNOSIS — E785 Hyperlipidemia, unspecified: Secondary | ICD-10-CM | POA: Diagnosis not present

## 2022-07-25 MED ORDER — SODIUM CHLORIDE 0.9 % IV SOLN
500.0000 mL | Freq: Once | INTRAVENOUS | Status: DC
Start: 2022-07-25 — End: 2022-07-25

## 2022-07-25 NOTE — Progress Notes (Signed)
Pt's states no medical or surgical changes since previsit or office visit. 

## 2022-07-25 NOTE — Patient Instructions (Addendum)
Handout on hemorrhoids, polyps, and diverticulosis given to patient Await pathology results Repeat colonoscopy in 5-10 years for surveillance based off of pathology results Repeat an upper GI endoscopy at appointment to be schedule  - scheduled for Tuesday July 30th at 8:30 am be here by 7:30 am (instructions included)   YOU HAD AN ENDOSCOPIC PROCEDURE TODAY AT THE [facility]:   Refer to the procedure report that was given to you for any specific questions about what was found during the examination.  If the procedure report does not answer your questions, please call your gastroenterologist to clarify.  If you requested that your care partner not be given the details of your procedure findings, then the procedure report has been included in a sealed envelope for you to review at your convenience later.  YOU SHOULD EXPECT: Some feelings of bloating in the abdomen. Passage of more gas than usual.  Walking can help get rid of the air that was put into your GI tract during the procedure and reduce the bloating. If you had a lower endoscopy (such as a colonoscopy or flexible sigmoidoscopy) you may notice spotting of blood in your stool or on the toilet paper. If you underwent a bowel prep for your procedure, you may not have a normal bowel movement for a few days.  Please Note:  You might notice some irritation and congestion in your nose or some drainage.  This is from the oxygen used during your procedure.  There is no need for concern and it should clear up in a day or so.  SYMPTOMS TO REPORT IMMEDIATELY:  Following lower endoscopy (colonoscopy or flexible sigmoidoscopy):  Excessive amounts of blood in the stool  Significant tenderness or worsening of abdominal pains  Swelling of the abdomen that is new, acute  Fever of 100F or higher  For urgent or emergent issues, a gastroenterologist can be reached at any hour by calling [phone]. Do not use MyChart messaging for urgent  concerns.    DIET:  We do recommend a small meal at first, but then you may proceed to your regular diet.  Drink plenty of fluids but you should avoid alcoholic beverages for 24 hours.  MEDICATIONS: Continue present medications.  Please see handouts given to you by your recovery nurse: Polyps, hemorrhoids.  FOLLOW UP: Repeat colonoscopy in 5-10 years fur surveillance based on pathology results. Return for an Upper Endoscopy scheduled for 08/01/2022 at 0830 (arrive at 0730). Please see new procedure instructions given to you by your recovery nurse.  Thank you for allowing [place] to provide for your healthcare needs today.  ACTIVITY:  You should plan to take it easy for the rest of today and you should NOT DRIVE or use heavy machinery until tomorrow (because of the sedation medicines used during the test).    FOLLOW UP: Our staff will call the number listed on your records the next business day following your procedure.  We will call around 7:15- 8:00 am to check on you and address any questions or concerns that you may have regarding the information given to you following your procedure. If we do not reach you, we will leave a message.     If any biopsies were taken you will be contacted by phone or by letter within the next 1-3 weeks.  Please call us at [phone] if you have not heard about the biopsies in 3 weeks.    SIGNATURES/CONFIDENTIALITY: You and/or your care partner have signed paperwork  which will be entered into your electronic medical record.  These signatures attest to the fact that that the information above on your After Visit Summary has been reviewed and is understood.  Full responsibility of the confidentiality of this discharge information lies with you and/or your care-partner.

## 2022-07-25 NOTE — Progress Notes (Signed)
Called to room to assist during endoscopic procedure.  Patient ID and intended procedure confirmed with present staff. Received instructions for my participation in the procedure from the performing physician.  

## 2022-07-25 NOTE — Progress Notes (Unsigned)
Gastroenterology History and Physical   Primary Care Physician:  Mliss Sax, MD   Reason for Procedure:  Positive Cologuard  Plan:    colonoscopy with possible interventions as needed     HPI: Vicki George is a very pleasant 67 y.o. female here for colonoscopy for evaluation of positive cologuard.   The risks and benefits as well as alternatives of endoscopic procedure(s) have been discussed and reviewed. All questions answered. The patient agrees to proceed.    Past Medical History:  Diagnosis Date   Chicken pox    Depression    Hyperlipidemia    Urinary tract infection     Past Surgical History:  Procedure Laterality Date   EYE SURGERY Bilateral 10/2020   MYOMECTOMY     PARATHYROIDECTOMY Left 11/13/2019   Procedure: LEFT PARATHYROIDECTOMY;  Surgeon: Axel Filler, MD;  Location: [facility] OR;  Service: General;  Laterality: Left;   PARATHYROIDECTOMY Right 02/15/2021   Procedure: RIGHT PARATHYROIDECTOMY;  Surgeon: Axel Filler, MD;  Location: [facility] OR;  Service: General;  Laterality: Right;    Prior to Admission medications   Medication Sig Start Date End Date Taking? Authorizing Provider  atorvastatin (LIPITOR) 10 MG tablet Take 1 tablet (10 mg total) by mouth daily. 03/10/22  Yes Mliss Sax, MD  ibuprofen (ADVIL) 200 MG tablet Take 400 mg by mouth every 6 (six) hours as needed for headache.    [provider]    Current Outpatient Medications  Medication Sig Dispense Refill   atorvastatin (LIPITOR) 10 MG tablet Take 1 tablet (10 mg total) by mouth daily. 90 tablet 3   ibuprofen (ADVIL) 200 MG tablet Take 400 mg by mouth every 6 (six) hours as needed for headache.     Current Facility-Administered Medications  Medication Dose Route Frequency Provider Last Rate Last Admin   0.9 %  sodium chloride infusion  500 mL Intravenous Once Napoleon Form, MD        Allergies as of 07/25/2022 - Review Complete 07/25/2022  Allergen  Reaction Noted   Aspirin Nausea Only 05/22/2013   Latex Itching 02/11/2021    Family History  Problem Relation Age of Onset   Hypercalcemia Neg Hx    Colon cancer Neg Hx    Stomach cancer Neg Hx    Esophageal cancer Neg Hx    Rectal cancer Neg Hx     Social History   Socioeconomic History   Marital status: Widowed    Spouse name: Not on file   Number of children: Not on file   Years of education: Not on file   Highest education level: Not on file  Occupational History   Not on file  Tobacco Use   Smoking status: Never   Smokeless tobacco: Never  Vaping Use   Vaping status: Never Used  Substance and Sexual Activity   Alcohol use: No   Drug use: No   Sexual activity: Not Currently  Other Topics Concern   Not on file  Social History Narrative   Not on file   Social Determinants of Health   Financial Resource Strain: Low Risk  (04/14/2022)   Overall Financial Resource Strain (CARDIA)    Difficulty of Paying Living Expenses: Not hard at all  Food Insecurity: No Food Insecurity (04/14/2022)   Hunger Vital Sign    Worried About Running Out of Food in the Last Year: Never true    Ran Out of Food in the Last Year: Never true  Transportation Needs: No Transportation  Needs (04/14/2022)   PRAPARE - Administrator, Civil Service (Medical): No    Lack of Transportation (Non-Medical): No  Physical Activity: Sufficiently Active (04/14/2022)   Exercise Vital Sign    Days of Exercise per Week: 7 days    Minutes of Exercise per Session: 60 min  Stress: No Stress Concern Present (04/14/2022)   [employer] of Occupational Health - Occupational Stress Questionnaire    Feeling of Stress : Not at all  Social Connections: Moderately Isolated (01/25/2021)   Social Connection and Isolation Panel [NHANES]    Frequency of Communication with Friends and Family: Twice a week    Frequency of Social Gatherings with Friends and Family: Twice a week    Attends Religious  Services: More than 4 times per year    Active Member of [employer] or Organizations: No    Attends Banker Meetings: Never    Marital Status: Widowed  Intimate Partner Violence: Not At Risk (01/25/2021)   Humiliation, Afraid, Rape, and Kick questionnaire    Fear of Current or Ex-Partner: No    Emotionally Abused: No    Physically Abused: No    Sexually Abused: No    Review of Systems:  All other review of systems negative except as mentioned in the HPI.  Physical Exam: Vital signs in last 24 hours: BP (!) 150/92   Pulse 75   Temp (!) 97.3 F (36.3 C) (Skin)   Ht 5\' 2"  (1.575 m)   Wt 150 lb (68 kg)   SpO2 100%   BMI 27.44 kg/m  General:   Alert, NAD Lungs:  Clear .   Heart:  Regular rate and rhythm Abdomen:  Soft, nontender and nondistended. Neuro/Psych:  Alert and cooperative. Normal mood and affect. A and O x 3  Reviewed labs, radiology imaging, old records and pertinent past GI work up  Patient is appropriate for planned procedure(s) and anesthesia in an ambulatory setting   K. Scherry Ran , MD [phone]

## 2022-07-25 NOTE — Op Note (Signed)
[facility] Patient Name: Vicki George Procedure Date: 07/25/2022 11:30 AM MRN: [id number] Endoscopist: Napoleon Form , MD, [id number] Age: 67 Referring MD:  Date of Birth: 1955/06/20 Gender: Female Account #: [id number] Procedure:                Colonoscopy Indications:              Positive Cologuard test Medicines:                Monitored Anesthesia Care Procedure:                Pre-Anesthesia Assessment:                           - Prior to the procedure, a History and Physical                            was performed, and patient medications and                            allergies were reviewed. The patient's tolerance of                            previous anesthesia was also reviewed. The risks                            and benefits of the procedure and the sedation                            options and risks were discussed with the patient.                            All questions were answered, and informed consent                            was obtained. Prior Anticoagulants: The patient has                            taken no anticoagulant or antiplatelet agents. ASA                            Grade Assessment: II - A patient with mild systemic                            disease. After reviewing the risks and benefits,                            the patient was deemed in satisfactory condition to                            undergo the procedure.                           After obtaining informed consent, the colonoscope  was passed under direct vision. Throughout the                            procedure, the patient's blood pressure, pulse, and                            oxygen saturations were monitored continuously. The                            Olympus Scope [id number] was introduced through the                            anus and advanced to the the cecum, identified by                            appendiceal orifice and  ileocecal valve. The                            colonoscopy was performed without difficulty. The                            patient tolerated the procedure well. The quality                            of the bowel preparation was good. The ileocecal                            valve, appendiceal orifice, and rectum were                            photographed. Scope In: 11:34:52 AM Scope Out: 11:47:50 AM Scope Withdrawal Time: 0 hours 8 minutes 50 seconds  Total Procedure Duration: 0 hours 12 minutes 58 seconds  Findings:                 The perianal and digital rectal examinations were                            normal.                           A 3 mm polyp was found in the ascending colon. The                            polyp was sessile. The polyp was removed with a                            cold snare. Resection and retrieval were complete.                           Scattered large-mouthed, medium-mouthed and                            small-mouthed diverticula were found in the sigmoid  colon, descending colon, transverse colon and                            ascending colon.                           Non-bleeding internal hemorrhoids were found during                            retroflexion. The hemorrhoids were medium-sized.                           The exam was otherwise without abnormality. Complications:            No immediate complications. Estimated Blood Loss:     Estimated blood loss: none. Impression:               - One 3 mm polyp in the ascending colon, removed                            with a cold snare. Resected and retrieved.                           - Diverticulosis in the sigmoid colon, in the                            descending colon, in the transverse colon and in                            the ascending colon.                           - Non-bleeding internal hemorrhoids.                           - The examination was otherwise  normal. Recommendation:           - Patient has a contact number available for                            emergencies. The signs and symptoms of potential                            delayed complications were discussed with the                            patient. Return to normal activities tomorrow.                            Written discharge instructions were provided to the                            patient.                           - Resume previous diet.                           -  Continue present medications.                           - Await pathology results.                           - Repeat colonoscopy in 5-10 years for surveillance                            based on pathology results.                           - Perform an upper GI endoscopy at appointment to                            be scheduled. Napoleon Form, MD 07/25/2022 11:54:07 AM This report has been signed electronically.

## 2022-07-25 NOTE — Progress Notes (Unsigned)
Report given to PACU, vss 

## 2022-07-26 ENCOUNTER — Telehealth: Payer: Self-pay

## 2022-07-26 ENCOUNTER — Encounter: Payer: Self-pay | Admitting: Gastroenterology

## 2022-07-26 NOTE — Telephone Encounter (Signed)
  Follow up Call-     07/25/2022   10:32 AM  Call back number  Post procedure Call Back phone  # [phone], [phone]  Permission to leave phone message Yes     Patient questions:  Do you have a fever, pain , or abdominal swelling? No. Pain Score  0 *  Have you tolerated food without any problems? Yes.    Have you been able to return to your normal activities? Yes.    Do you have any questions about your discharge instructions: Diet   No. Medications  No. Follow up visit  No.  Do you have questions or concerns about your Care? No.  Actions: * If pain score is 4 or above: No action needed, pain <4.

## 2022-07-31 ENCOUNTER — Telehealth: Payer: Self-pay | Admitting: Gastroenterology

## 2022-07-31 DIAGNOSIS — E210 Primary hyperparathyroidism: Secondary | ICD-10-CM | POA: Diagnosis not present

## 2022-07-31 DIAGNOSIS — R030 Elevated blood-pressure reading, without diagnosis of hypertension: Secondary | ICD-10-CM | POA: Diagnosis not present

## 2022-07-31 DIAGNOSIS — R7303 Prediabetes: Secondary | ICD-10-CM | POA: Diagnosis not present

## 2022-07-31 NOTE — Telephone Encounter (Signed)
Inbound call from patient , states she is having a severe headache and she is taking some ibuprofen and wanting to know if she will have to reschedule tomorrows procedure.Please advise.

## 2022-07-31 NOTE — Telephone Encounter (Signed)
Returned the patient's phone call. She started with a headache this am and isn't feeling well. She's scheduled for a EGD tomorrow per Dr. Lavon Paganini. She was questioning why she was having this procedure done. The indication under appt note is for Positive Cologuard? Pt wishes to cancel her procedure for tomorrow and would like to know why this EGD is necessary. Will ask for [place]and[place]am to advise.

## 2022-08-01 ENCOUNTER — Encounter: Payer: PPO | Admitting: Gastroenterology

## 2022-08-01 NOTE — Telephone Encounter (Signed)
We did not find any significant pathology on colonoscopy to account for positive Cologuard, EGD to exclude any possible upper GI source.  But if patient does not want to proceed and she is asymptomatic, okay to cancel and she can call us back if needed.

## 2022-08-04 ENCOUNTER — Encounter: Payer: Self-pay | Admitting: Gastroenterology

## 2022-08-17 ENCOUNTER — Encounter (INDEPENDENT_AMBULATORY_CARE_PROVIDER_SITE_OTHER): Payer: Self-pay

## 2022-08-17 DIAGNOSIS — E210 Primary hyperparathyroidism: Secondary | ICD-10-CM | POA: Diagnosis not present

## 2022-08-17 DIAGNOSIS — D351 Benign neoplasm of parathyroid gland: Secondary | ICD-10-CM | POA: Diagnosis not present

## 2022-08-17 DIAGNOSIS — E041 Nontoxic single thyroid nodule: Secondary | ICD-10-CM | POA: Diagnosis not present

## 2022-09-12 ENCOUNTER — Encounter: Payer: Self-pay | Admitting: Family Medicine

## 2022-09-12 ENCOUNTER — Ambulatory Visit (INDEPENDENT_AMBULATORY_CARE_PROVIDER_SITE_OTHER): Payer: PPO | Admitting: Family Medicine

## 2022-09-12 VITALS — BP 118/70 | HR 88 | Temp 97.9°F | Resp 16 | Ht 62.0 in | Wt 148.2 lb

## 2022-09-12 DIAGNOSIS — R4589 Other symptoms and signs involving emotional state: Secondary | ICD-10-CM

## 2022-09-12 DIAGNOSIS — E875 Hyperkalemia: Secondary | ICD-10-CM | POA: Diagnosis not present

## 2022-09-12 DIAGNOSIS — E7800 Pure hypercholesterolemia, unspecified: Secondary | ICD-10-CM

## 2022-09-12 DIAGNOSIS — R7303 Prediabetes: Secondary | ICD-10-CM | POA: Diagnosis not present

## 2022-09-12 LAB — BASIC METABOLIC PANEL
BUN: 11 mg/dL (ref 6–23)
CO2: 30 meq/L (ref 19–32)
Calcium: 11 mg/dL — ABNORMAL HIGH (ref 8.4–10.5)
Chloride: 106 meq/L (ref 96–112)
Creatinine, Ser: 0.71 mg/dL (ref 0.40–1.20)
GFR: 87.73 mL/min (ref >60.00)
Glucose, Bld: 99 mg/dL (ref 70–99)
Potassium: 5.7 meq/L — ABNORMAL HIGH (ref 3.5–5.1)
Sodium: 139 meq/L (ref 135–145)

## 2022-09-12 LAB — HEMOGLOBIN A1C: Hgb A1c MFr Bld: 6.1 % (ref 4.6–6.5)

## 2022-09-12 NOTE — Progress Notes (Signed)
Follow-up in  Established Patient Office Visit   Subjective:  Patient ID: Vicki George, female    DOB: 1955-12-28  Age: 67 y.o. MRN: [id number]  Chief Complaint  Patient presents with   Follow-up    Follow up    HPI Encounter Diagnoses  Name Primary?   Elevated LDL cholesterol level Yes   Prediabetes    Anxiety about health    For follow-up of above.  Discontinued Lexapro and has been dealing with anxiety and sadness without it.  She is not interested in continuing it.  Blood pressure is well-controlled without medication, weight loss and low-salt diet.  Last A1c was 6.  Continues atorvastatin for elevated LDL cholesterol that is well-controlled.  Next parathyroid surgery is scheduled for next Friday.  Did have colonoscopy and is now on a 7-year schedule.   Review of Systems  Constitutional: Negative.   HENT: Negative.    Eyes:  Negative for blurred vision, discharge and redness.  Respiratory: Negative.    Cardiovascular: Negative.   Gastrointestinal:  Negative for abdominal pain.  Genitourinary: Negative.   Musculoskeletal: Negative.  Negative for myalgias.  Skin:  Negative for rash.  Neurological:  Negative for tingling, loss of consciousness and weakness.  Endo/Heme/Allergies:  Negative for polydipsia.     Current Outpatient Medications:    atorvastatin (LIPITOR) 10 MG tablet, Take 1 tablet (10 mg total) by mouth daily., Disp: 90 tablet, Rfl: 3   ibuprofen (ADVIL) 200 MG tablet, Take 400 mg by mouth every 6 (six) hours as needed for headache., Disp: , Rfl:    Objective:     BP 118/70 (BP Location: Left Arm, Patient Position: Sitting, Cuff Size: Normal)   Pulse 88   Temp 97.9 F (36.6 C) (Oral)   Resp 16   Ht 5\' 2"  (1.575 m)   Wt 148 lb 3.2 oz (67.2 kg)   SpO2 100%   BMI 27.11 kg/m  BP Readings from Last 3 Encounters:  09/12/22 118/70  07/25/22 122/73  06/12/22 130/76   Wt Readings from Last 3 Encounters:  09/12/22 148 lb 3.2 oz (67.2 kg)  07/25/22 150 lb  (68 kg)  07/20/22 150 lb (68 kg)      Physical Exam Constitutional:      General: She is not in acute distress.    Appearance: Normal appearance. She is not ill-appearing, toxic-appearing or diaphoretic.  HENT:     Head: Normocephalic and atraumatic.     Right Ear: External ear normal.     Left Ear: External ear normal.  Eyes:     General: No scleral icterus.       Right eye: No discharge.        Left eye: No discharge.     Extraocular Movements: Extraocular movements intact.     Conjunctiva/sclera: Conjunctivae normal.  Cardiovascular:     Rate and Rhythm: Normal rate and regular rhythm.  Pulmonary:     Effort: Pulmonary effort is normal. No respiratory distress.     Breath sounds: Normal breath sounds.  Musculoskeletal:     Cervical back: No rigidity or tenderness.  Lymphadenopathy:     Cervical: No cervical adenopathy.  Skin:    General: Skin is warm and dry.  Neurological:     Mental Status: She is alert and oriented to person, place, and time.  Psychiatric:        Mood and Affect: Mood normal.        Behavior: Behavior normal.      No  results found for any visits on 09/12/22.    The ASCVD Risk score (Arnett DK, et al., 2019) failed to calculate for the following reasons:   The valid total cholesterol range is 130 to 320 mg/dL    Assessment & Plan:   Elevated LDL cholesterol level  Prediabetes -     Basic metabolic panel -     Hemoglobin A1c  Anxiety about health    Return in about 6 months (around 03/12/2023).  Continue current medications.  Continue exercise and weight loss efforts.  Discussed using metformin as a preventative agent.  She declines for now.  Mliss Sax, MD

## 2022-09-13 NOTE — Addendum Note (Signed)
Addended by: Andrez Grime on: 09/13/2022 08:05 AM   Modules accepted: Orders

## 2022-09-15 DIAGNOSIS — E210 Primary hyperparathyroidism: Secondary | ICD-10-CM | POA: Diagnosis not present

## 2022-09-15 DIAGNOSIS — E213 Hyperparathyroidism, unspecified: Secondary | ICD-10-CM | POA: Diagnosis not present

## 2022-09-15 DIAGNOSIS — Z79899 Other long term (current) drug therapy: Secondary | ICD-10-CM | POA: Diagnosis not present

## 2022-09-15 DIAGNOSIS — E049 Nontoxic goiter, unspecified: Secondary | ICD-10-CM | POA: Diagnosis not present

## 2022-09-15 DIAGNOSIS — M8580 Other specified disorders of bone density and structure, unspecified site: Secondary | ICD-10-CM | POA: Diagnosis not present

## 2022-09-15 DIAGNOSIS — Z79890 Hormone replacement therapy (postmenopausal): Secondary | ICD-10-CM | POA: Diagnosis not present

## 2022-09-15 DIAGNOSIS — R531 Weakness: Secondary | ICD-10-CM | POA: Diagnosis not present

## 2022-09-15 DIAGNOSIS — D351 Benign neoplasm of parathyroid gland: Secondary | ICD-10-CM | POA: Diagnosis not present

## 2022-10-03 DIAGNOSIS — Z09 Encounter for follow-up examination after completed treatment for conditions other than malignant neoplasm: Secondary | ICD-10-CM | POA: Diagnosis not present

## 2022-10-03 DIAGNOSIS — E213 Hyperparathyroidism, unspecified: Secondary | ICD-10-CM | POA: Diagnosis not present

## 2022-10-05 ENCOUNTER — Other Ambulatory Visit (INDEPENDENT_AMBULATORY_CARE_PROVIDER_SITE_OTHER): Payer: PPO

## 2022-10-05 DIAGNOSIS — E875 Hyperkalemia: Secondary | ICD-10-CM | POA: Diagnosis not present

## 2022-10-05 LAB — POTASSIUM: Potassium: 5.2 meq/L — ABNORMAL HIGH (ref 3.5–5.1)

## 2022-10-12 ENCOUNTER — Telehealth: Payer: Self-pay | Admitting: Family Medicine

## 2022-10-12 NOTE — Telephone Encounter (Signed)
Pt had surgery on 10/13 since she has been home she has been unable to sleep. She has tried a few suggestions but nothing is helping. She feels it has caused her Bp to raise.  Please advise.  Corrie Dandy [phone]

## 2022-10-13 NOTE — Telephone Encounter (Signed)
Called to schedule pt for appt with PCP. Pt hs OV on 10/16/22.

## 2022-10-16 ENCOUNTER — Encounter: Payer: Self-pay | Admitting: Family Medicine

## 2022-10-16 ENCOUNTER — Ambulatory Visit (INDEPENDENT_AMBULATORY_CARE_PROVIDER_SITE_OTHER): Payer: PPO | Admitting: Family Medicine

## 2022-10-16 VITALS — BP 148/88 | HR 81 | Temp 98.7°F | Ht 62.0 in | Wt 148.2 lb

## 2022-10-16 DIAGNOSIS — R030 Elevated blood-pressure reading, without diagnosis of hypertension: Secondary | ICD-10-CM

## 2022-10-16 DIAGNOSIS — G4709 Other insomnia: Secondary | ICD-10-CM | POA: Insufficient documentation

## 2022-10-16 DIAGNOSIS — F439 Reaction to severe stress, unspecified: Secondary | ICD-10-CM | POA: Diagnosis not present

## 2022-10-16 MED ORDER — ESCITALOPRAM OXALATE 10 MG PO TABS
10.0000 mg | ORAL_TABLET | Freq: Every day | ORAL | 1 refills | Status: DC
Start: 2022-10-16 — End: 2022-12-05

## 2022-10-16 NOTE — Progress Notes (Signed)
Established Patient Office Visit   Subjective:  Patient ID: Vicki George, female    DOB: 1955-02-24  Age: 67 y.o. MRN: [id number]  Chief Complaint  Patient presents with   Hypertension   Insomnia    Patient states it been about a month started after having anesthesia during a surgery. Her b/p is elvated when she checks it and concern that might be what's causing it     Hypertension  Insomnia   Encounter Diagnoses  Name Primary?   Elevated BP without diagnosis of hypertension Yes   Other insomnia    Stress at home    Calcium has normalized since her parathyroid surgery on the 13th of last month.  Blood pressure has been elevated over the last weeks.  She has not been sleeping.  She had discontinued the Lexapro just prior to the last visit..   Review of Systems  Psychiatric/Behavioral:  The patient has insomnia.      Current Outpatient Medications:    atorvastatin (LIPITOR) 10 MG tablet, Take 1 tablet (10 mg total) by mouth daily., Disp: 90 tablet, Rfl: 3   escitalopram (LEXAPRO) 10 MG tablet, Take 1 tablet (10 mg total) by mouth at bedtime., Disp: 30 tablet, Rfl: 1   ibuprofen (ADVIL) 200 MG tablet, Take 400 mg by mouth every 6 (six) hours as needed for headache., Disp: , Rfl:    Objective:     BP (!) 148/88 (BP Location: Left Arm, Patient Position: Sitting, Cuff Size: Normal)   Pulse 81   Temp 98.7 F (37.1 C) (Temporal)   Ht 5\' 2"  (1.575 m)   Wt 148 lb 3.2 oz (67.2 kg)   SpO2 98%   BMI 27.11 kg/m  BP Readings from Last 3 Encounters:  10/16/22 (!) 148/88  09/12/22 118/70  07/25/22 122/73   Wt Readings from Last 3 Encounters:  10/16/22 148 lb 3.2 oz (67.2 kg)  09/12/22 148 lb 3.2 oz (67.2 kg)  07/25/22 150 lb (68 kg)      Physical Exam Constitutional:      General: She is not in acute distress.    Appearance: Normal appearance. She is not ill-appearing, toxic-appearing or diaphoretic.  HENT:     Head: Normocephalic and atraumatic.     Right Ear:  External ear normal.     Left Ear: External ear normal.  Eyes:     General: No scleral icterus.       Right eye: No discharge.        Left eye: No discharge.     Extraocular Movements: Extraocular movements intact.     Conjunctiva/sclera: Conjunctivae normal.  Pulmonary:     Effort: Pulmonary effort is normal. No respiratory distress.  Skin:    General: Skin is warm and dry.  Neurological:     Mental Status: She is alert and oriented to person, place, and time.  Psychiatric:        Mood and Affect: Mood normal.        Behavior: Behavior normal.      No results found for any visits on 10/16/22.    The ASCVD Risk score (Arnett DK, et al., 2019) failed to calculate for the following reasons:   The valid total cholesterol range is 130 to 320 mg/dL    Assessment & Plan:   Elevated BP without diagnosis of hypertension  Other insomnia -     Escitalopram Oxalate; Take 1 tablet (10 mg total) by mouth at bedtime.  Dispense: 30 tablet; Refill: 1  Stress at home -     Escitalopram Oxalate; Take 1 tablet (10 mg total) by mouth at bedtime.  Dispense: 30 tablet; Refill: 1    Return in about 4 weeks (around 11/13/2022).  Restart Lexapro and follow-up in 4 weeks.  Information was given on preventing hypertension.  Mliss Sax, MD

## 2022-11-21 DIAGNOSIS — E210 Primary hyperparathyroidism: Secondary | ICD-10-CM | POA: Diagnosis not present

## 2022-11-21 DIAGNOSIS — R030 Elevated blood-pressure reading, without diagnosis of hypertension: Secondary | ICD-10-CM | POA: Diagnosis not present

## 2022-11-21 DIAGNOSIS — E7800 Pure hypercholesterolemia, unspecified: Secondary | ICD-10-CM | POA: Diagnosis not present

## 2022-11-21 DIAGNOSIS — E875 Hyperkalemia: Secondary | ICD-10-CM | POA: Diagnosis not present

## 2022-11-21 DIAGNOSIS — R7303 Prediabetes: Secondary | ICD-10-CM | POA: Diagnosis not present

## 2022-12-05 ENCOUNTER — Ambulatory Visit (INDEPENDENT_AMBULATORY_CARE_PROVIDER_SITE_OTHER): Payer: PPO | Admitting: Family Medicine

## 2022-12-05 ENCOUNTER — Encounter: Payer: Self-pay | Admitting: Family Medicine

## 2022-12-05 VITALS — BP 138/68 | HR 76 | Temp 98.2°F | Ht 62.0 in | Wt 146.6 lb

## 2022-12-05 DIAGNOSIS — E7800 Pure hypercholesterolemia, unspecified: Secondary | ICD-10-CM

## 2022-12-05 DIAGNOSIS — R7303 Prediabetes: Secondary | ICD-10-CM | POA: Diagnosis not present

## 2022-12-05 DIAGNOSIS — G4709 Other insomnia: Secondary | ICD-10-CM

## 2022-12-05 DIAGNOSIS — F439 Reaction to severe stress, unspecified: Secondary | ICD-10-CM

## 2022-12-05 DIAGNOSIS — R030 Elevated blood-pressure reading, without diagnosis of hypertension: Secondary | ICD-10-CM | POA: Diagnosis not present

## 2022-12-05 MED ORDER — METFORMIN HCL ER 500 MG PO TB24: 500.0000 mg | ORAL_TABLET | Freq: Every evening | ORAL | 1 refills | Status: DC

## 2022-12-05 MED ORDER — ESCITALOPRAM OXALATE 10 MG PO TABS: 10.0000 mg | ORAL_TABLET | Freq: Every day | ORAL | 2 refills | Status: DC

## 2022-12-05 NOTE — Progress Notes (Signed)
Established Patient Office Visit   Subjective:  Patient ID: Vicki George, female    DOB: 01-09-1955  Age: 67 y.o. MRN: [id number]  Chief Complaint  Patient presents with   Medical Management of Chronic Issues    4 week follow up. Pt states improvement.    HPI Encounter Diagnoses  Name Primary?   Stress at home Yes   Prediabetes    Elevated LDL cholesterol level    Other insomnia    Elevated BP without diagnosis of hypertension    Doing much better on the Lexapro.  Mood and anxiety levels are improved.  She is sleeping better.  Her son has a job with Adult nurse and is enjoying himself with it.  She has tried to lose weight and is avoiding simple carbohydrates.  Continues atorvastatin for elevated cholesterol.  PTH and calcium levels have normalized.   Review of Systems  Constitutional: Negative.   HENT: Negative.    Eyes:  Negative for blurred vision, discharge and redness.  Respiratory: Negative.    Cardiovascular: Negative.   Gastrointestinal:  Negative for abdominal pain.  Genitourinary: Negative.   Musculoskeletal: Negative.  Negative for myalgias.  Skin:  Negative for rash.  Neurological:  Negative for tingling, loss of consciousness and weakness.  Endo/Heme/Allergies:  Negative for polydipsia.     Current Outpatient Medications:    metFORMIN (GLUCOPHAGE-XR) 500 MG 24 hr tablet, Take 1 tablet (500 mg total) by mouth at bedtime., Disp: 90 tablet, Rfl: 1   atorvastatin (LIPITOR) 10 MG tablet, Take 1 tablet (10 mg total) by mouth daily., Disp: 90 tablet, Rfl: 3   escitalopram (LEXAPRO) 10 MG tablet, Take 1 tablet (10 mg total) by mouth at bedtime., Disp: 90 tablet, Rfl: 2   ibuprofen (ADVIL) 200 MG tablet, Take 400 mg by mouth every 6 (six) hours as needed for headache., Disp: , Rfl:    Objective:     BP 138/68   Pulse 76   Temp 98.2 F (36.8 C)   Ht 5\' 2"  (1.575 m)   Wt 146 lb 9.6 oz (66.5 kg)   SpO2 98%   BMI 26.81 kg/m  BP Readings from Last 3 Encounters:   12/05/22 138/68  10/16/22 (!) 148/88  09/12/22 118/70   Wt Readings from Last 3 Encounters:  12/05/22 146 lb 9.6 oz (66.5 kg)  10/16/22 148 lb 3.2 oz (67.2 kg)  09/12/22 148 lb 3.2 oz (67.2 kg)      Physical Exam Constitutional:      General: She is not in acute distress.    Appearance: Normal appearance. She is not ill-appearing, toxic-appearing or diaphoretic.  HENT:     Head: Normocephalic and atraumatic.     Right Ear: External ear normal.     Left Ear: External ear normal.  Eyes:     General: No scleral icterus.       Right eye: No discharge.        Left eye: No discharge.     Extraocular Movements: Extraocular movements intact.     Conjunctiva/sclera: Conjunctivae normal.  Pulmonary:     Effort: Pulmonary effort is normal. No respiratory distress.  Skin:    General: Skin is warm and dry.  Neurological:     Mental Status: She is alert and oriented to person, place, and time.  Psychiatric:        Mood and Affect: Mood normal.        Behavior: Behavior normal.      No results found for  any visits on 12/05/22.    The ASCVD Risk score (Arnett DK, et al., 2019) failed to calculate for the following reasons:   The valid total cholesterol range is 130 to 320 mg/dL    Assessment & Plan:   Stress at home -     Escitalopram Oxalate; Take 1 tablet (10 mg total) by mouth at bedtime.  Dispense: 90 tablet; Refill: 2  Prediabetes -     metFORMIN HCl ER; Take 1 tablet (500 mg total) by mouth at bedtime.  Dispense: 90 tablet; Refill: 1  Elevated LDL cholesterol level  Other insomnia -     Escitalopram Oxalate; Take 1 tablet (10 mg total) by mouth at bedtime.  Dispense: 90 tablet; Refill: 2  Elevated BP without diagnosis of hypertension    Return in about 3 months (around 03/05/2023).  We discussed starting metformin for the prevention of progression to diabetes.  We discussed side effect to be expected that past for most people.  She will let me know if they do  not.  Information was given on preventing hypertension.  Encouraged ongoing weight loss efforts and exercise.  She will avoid sugary drinks and limit sweets.  Mliss Sax, MD

## 2022-12-08 DIAGNOSIS — H524 Presbyopia: Secondary | ICD-10-CM | POA: Diagnosis not present

## 2022-12-08 DIAGNOSIS — H04123 Dry eye syndrome of bilateral lacrimal glands: Secondary | ICD-10-CM | POA: Diagnosis not present

## 2022-12-08 DIAGNOSIS — Z961 Presence of intraocular lens: Secondary | ICD-10-CM | POA: Diagnosis not present

## 2023-03-12 ENCOUNTER — Ambulatory Visit (INDEPENDENT_AMBULATORY_CARE_PROVIDER_SITE_OTHER): Payer: PPO | Admitting: Family Medicine

## 2023-03-12 ENCOUNTER — Encounter: Payer: Self-pay | Admitting: Family Medicine

## 2023-03-12 VITALS — BP 142/78 | HR 77 | Temp 97.4°F | Ht 62.0 in | Wt 147.6 lb

## 2023-03-12 DIAGNOSIS — E875 Hyperkalemia: Secondary | ICD-10-CM | POA: Diagnosis not present

## 2023-03-12 DIAGNOSIS — E7800 Pure hypercholesterolemia, unspecified: Secondary | ICD-10-CM | POA: Diagnosis not present

## 2023-03-12 DIAGNOSIS — R7303 Prediabetes: Secondary | ICD-10-CM | POA: Diagnosis not present

## 2023-03-12 DIAGNOSIS — I10 Essential (primary) hypertension: Secondary | ICD-10-CM

## 2023-03-12 LAB — LIPID PANEL
Cholesterol: 177 mg/dL (ref 0–200)
HDL: 49.5 mg/dL (ref >39.00)
LDL Cholesterol: 113 mg/dL — ABNORMAL HIGH (ref 0–99)
NonHDL: 127.46
Total CHOL/HDL Ratio: 4
Triglycerides: 74 mg/dL (ref 0.0–149.0)
VLDL: 14.8 mg/dL (ref 0.0–40.0)

## 2023-03-12 LAB — BASIC METABOLIC PANEL
BUN: 21 mg/dL (ref 6–23)
CO2: 28 meq/L (ref 19–32)
Calcium: 8.9 mg/dL (ref 8.4–10.5)
Chloride: 104 meq/L (ref 96–112)
Creatinine, Ser: 0.68 mg/dL (ref 0.40–1.20)
GFR: 89.55 mL/min (ref >60.00)
Glucose, Bld: 94 mg/dL (ref 70–99)
Potassium: 5.3 meq/L — ABNORMAL HIGH (ref 3.5–5.1)
Sodium: 140 meq/L (ref 135–145)

## 2023-03-12 LAB — HEMOGLOBIN A1C: Hgb A1c MFr Bld: 6 % (ref 4.6–6.5)

## 2023-03-12 MED ORDER — LISINOPRIL 10 MG PO TABS: 10.0000 mg | ORAL_TABLET | Freq: Every day | ORAL | 1 refills | Status: DC

## 2023-03-12 NOTE — Progress Notes (Signed)
 Established Patient Office Visit   Subjective:  Patient ID: Vicki George, female    DOB: 1955/08/10  Age: 68 y.o. MRN: [id number]  Chief Complaint  Patient presents with   Medical Management of Chronic Issues    6 month follow up. Pt is fasting.     HPI Encounter Diagnoses  Name Primary?   Prediabetes Yes   Elevated LDL cholesterol level    Essential hypertension    For follow-up of above.  It looks as though hyperparathyroidism has been resolved.  Continues metformin for diabetes without issue.  She has been exercising more with other.  Continues atorvastatin.  Blood pressures have been elevated the last 3 clinic visits.   Review of Systems  Constitutional: Negative.   HENT: Negative.    Eyes:  Negative for blurred vision, discharge and redness.  Respiratory: Negative.    Cardiovascular: Negative.   Gastrointestinal:  Negative for abdominal pain.  Genitourinary: Negative.   Musculoskeletal: Negative.  Negative for myalgias.  Skin:  Negative for rash.  Neurological:  Negative for tingling, loss of consciousness and weakness.  Endo/Heme/Allergies:  Negative for polydipsia.     Current Outpatient Medications:    atorvastatin (LIPITOR) 10 MG tablet, Take 1 tablet (10 mg total) by mouth daily., Disp: 90 tablet, Rfl: 3   ibuprofen (ADVIL) 200 MG tablet, Take 400 mg by mouth every 6 (six) hours as needed for headache., Disp: , Rfl:    lisinopril (ZESTRIL) 10 MG tablet, Take 1 tablet (10 mg total) by mouth daily., Disp: 90 tablet, Rfl: 1   metFORMIN (GLUCOPHAGE-XR) 500 MG 24 hr tablet, Take 1 tablet (500 mg total) by mouth at bedtime., Disp: 90 tablet, Rfl: 1   Objective:     BP (!) 142/78   Pulse 77   Temp (!) 97.4 F (36.3 C)   Ht 5\' 2"  (1.575 m)   Wt 147 lb 9.6 oz (67 kg)   SpO2 99%   BMI 27.00 kg/m  BP Readings from Last 3 Encounters:  03/12/23 (!) 142/78  12/05/22 138/68  10/16/22 (!) 148/88   Wt Readings from Last 3 Encounters:  03/12/23 147 lb 9.6 oz (67  kg)  12/05/22 146 lb 9.6 oz (66.5 kg)  10/16/22 148 lb 3.2 oz (67.2 kg)      Physical Exam Constitutional:      General: She is not in acute distress.    Appearance: Normal appearance. She is not ill-appearing, toxic-appearing or diaphoretic.  HENT:     Head: Normocephalic and atraumatic.     Right Ear: External ear normal.     Left Ear: External ear normal.     Mouth/Throat:     Mouth: Mucous membranes are moist.     Pharynx: Oropharynx is clear. No oropharyngeal exudate or posterior oropharyngeal erythema.  Eyes:     General: No scleral icterus.       Right eye: No discharge.        Left eye: No discharge.     Extraocular Movements: Extraocular movements intact.     Conjunctiva/sclera: Conjunctivae normal.     Pupils: Pupils are equal, round, and reactive to light.  Cardiovascular:     Rate and Rhythm: Normal rate and regular rhythm.  Pulmonary:     Effort: Pulmonary effort is normal. No respiratory distress.     Breath sounds: Normal breath sounds. No wheezing or rales.  Musculoskeletal:     Cervical back: No rigidity or tenderness.  Skin:    General: Skin is  warm and dry.  Neurological:     Mental Status: She is alert and oriented to person, place, and time.  Psychiatric:        Mood and Affect: Mood normal.        Behavior: Behavior normal.      No results found for any visits on 03/12/23.    The ASCVD Risk score (Arnett DK, et al., 2019) failed to calculate for the following reasons:   The valid total cholesterol range is 130 to 320 mg/dL    Assessment & Plan:   Prediabetes -     Basic metabolic panel -     Hemoglobin A1c  Elevated LDL cholesterol level -     Lipid panel  Essential hypertension -     Basic metabolic panel -     Lisinopril; Take 1 tablet (10 mg total) by mouth daily.  Dispense: 90 tablet; Refill: 1    Return in about 6 months (around 09/12/2023).  Continue atorvastatin and metformin.  Cholesterol and prediabetes.  Have added  lisinopril 10 for hypertension.  Warned about cough.  She will let me know.  Check and record blood pressures periodically.  Information given on managing hypertension and ACE inhibitor's.  Mliss Sax, MD

## 2023-03-13 ENCOUNTER — Encounter: Payer: Self-pay | Admitting: Family Medicine

## 2023-03-13 DIAGNOSIS — I10 Essential (primary) hypertension: Secondary | ICD-10-CM

## 2023-03-13 HISTORY — DX: Essential (primary) hypertension: I10

## 2023-03-13 NOTE — Addendum Note (Signed)
 Addended by: Andrez Grime on: 03/13/2023 03:24 PM   Modules accepted: Orders

## 2023-03-15 ENCOUNTER — Other Ambulatory Visit (HOSPITAL_BASED_OUTPATIENT_CLINIC_OR_DEPARTMENT_OTHER): Payer: Self-pay | Admitting: Family Medicine

## 2023-03-15 DIAGNOSIS — Z1231 Encounter for screening mammogram for malignant neoplasm of breast: Secondary | ICD-10-CM

## 2023-03-21 ENCOUNTER — Other Ambulatory Visit (INDEPENDENT_AMBULATORY_CARE_PROVIDER_SITE_OTHER)

## 2023-03-21 DIAGNOSIS — E875 Hyperkalemia: Secondary | ICD-10-CM

## 2023-03-21 LAB — CORTISOL: Cortisol, Plasma: 5.9 ug/dL

## 2023-03-22 ENCOUNTER — Encounter (HOSPITAL_BASED_OUTPATIENT_CLINIC_OR_DEPARTMENT_OTHER): Payer: Self-pay

## 2023-03-22 ENCOUNTER — Ambulatory Visit (HOSPITAL_BASED_OUTPATIENT_CLINIC_OR_DEPARTMENT_OTHER)
Admission: RE | Admit: 2023-03-22 | Discharge: 2023-03-22 | Disposition: A | Discharge status: Home / Self Care | Source: Ambulatory Visit | Attending: Family Medicine | Admitting: Family Medicine

## 2023-03-22 DIAGNOSIS — Z1231 Encounter for screening mammogram for malignant neoplasm of breast: Secondary | ICD-10-CM | POA: Insufficient documentation

## 2023-03-29 LAB — ALDOSTERONE + RENIN ACTIVITY W/ RATIO
ALDO / PRA Ratio: 7.6 ratio (ref 0.9–28.9)
Aldosterone: 6 ng/dL
Renin Activity: 0.79 ng/mL/h (ref 0.25–5.82)

## 2023-03-29 LAB — 21-HYDROXYLASE ANTIBODIES: 21-Hydroxylase Antibodies: NEGATIVE

## 2023-05-22 DIAGNOSIS — R030 Elevated blood-pressure reading, without diagnosis of hypertension: Secondary | ICD-10-CM | POA: Diagnosis not present

## 2023-05-22 DIAGNOSIS — E7800 Pure hypercholesterolemia, unspecified: Secondary | ICD-10-CM | POA: Diagnosis not present

## 2023-05-22 DIAGNOSIS — R7303 Prediabetes: Secondary | ICD-10-CM | POA: Diagnosis not present

## 2023-05-22 DIAGNOSIS — Z8639 Personal history of other endocrine, nutritional and metabolic disease: Secondary | ICD-10-CM | POA: Diagnosis not present

## 2023-09-13 ENCOUNTER — Encounter: Payer: Self-pay | Admitting: Family Medicine

## 2023-09-13 ENCOUNTER — Ambulatory Visit (INDEPENDENT_AMBULATORY_CARE_PROVIDER_SITE_OTHER): Admitting: Family Medicine

## 2023-09-13 ENCOUNTER — Ambulatory Visit: Payer: Self-pay | Admitting: Family Medicine

## 2023-09-13 VITALS — BP 130/80 | HR 75 | Temp 97.4°F | Ht 62.0 in | Wt 149.2 lb

## 2023-09-13 DIAGNOSIS — R7303 Prediabetes: Secondary | ICD-10-CM

## 2023-09-13 DIAGNOSIS — Z23 Encounter for immunization: Secondary | ICD-10-CM

## 2023-09-13 DIAGNOSIS — I10 Essential (primary) hypertension: Secondary | ICD-10-CM | POA: Diagnosis not present

## 2023-09-13 DIAGNOSIS — E7800 Pure hypercholesterolemia, unspecified: Secondary | ICD-10-CM

## 2023-09-13 LAB — HEMOGLOBIN A1C: Hgb A1c MFr Bld: 6.2 % (ref 4.6–6.5)

## 2023-09-13 LAB — BASIC METABOLIC PANEL WITH GFR
BUN: 16 mg/dL (ref 6–23)
CO2: 29 meq/L (ref 19–32)
Calcium: 9.1 mg/dL (ref 8.4–10.5)
Chloride: 102 meq/L (ref 96–112)
Creatinine, Ser: 0.73 mg/dL (ref 0.40–1.20)
GFR: 84.26 mL/min (ref >60.00)
Glucose, Bld: 93 mg/dL (ref 70–99)
Potassium: 4.8 meq/L (ref 3.5–5.1)
Sodium: 138 meq/L (ref 135–145)

## 2023-09-13 LAB — LDL CHOLESTEROL, DIRECT: Direct LDL: 111 mg/dL

## 2023-09-13 MED ORDER — LISINOPRIL 10 MG PO TABS: 10.0000 mg | ORAL_TABLET | Freq: Every day | ORAL | 1 refills | Status: AC

## 2023-09-13 MED ORDER — METFORMIN HCL ER 500 MG PO TB24: 500.0000 mg | ORAL_TABLET | Freq: Every evening | ORAL | 1 refills | Status: DC

## 2023-09-13 MED ORDER — ATORVASTATIN CALCIUM 20 MG PO TABS: 20.0000 mg | ORAL_TABLET | Freq: Every day | ORAL | 3 refills | Status: AC

## 2023-09-13 NOTE — Progress Notes (Addendum)
 Established Patient Office Visit   Subjective:  Patient ID: Vicki George, female    DOB: Mar 15, 1955  Age: 68 y.o. MRN: [id number]  Chief Complaint  Patient presents with   Medical Management of Chronic Issues    6 month follow up. Pt is fasting. No concerns. New Rx of lisinopril  is working pt tolerating well.     HPI Encounter Diagnoses  Name Primary?   Elevated LDL cholesterol level Yes   Prediabetes    Essential hypertension    Immunization due    For follow-up of above.  Taking atorvastatin  daily.  Continues with metformin  for prediabetes.   Review of Systems  Constitutional: Negative.   HENT: Negative.    Eyes:  Negative for blurred vision, discharge and redness.  Respiratory: Negative.    Cardiovascular: Negative.   Gastrointestinal:  Negative for abdominal pain.  Genitourinary: Negative.   Musculoskeletal: Negative.  Negative for myalgias.  Skin:  Negative for rash.  Neurological:  Negative for tingling, loss of consciousness and weakness.  Endo/Heme/Allergies:  Negative for polydipsia.     Current Outpatient Medications:    atorvastatin  (LIPITOR) 20 MG tablet, Take 1 tablet (20 mg total) by mouth daily., Disp: 90 tablet, Rfl: 3   ibuprofen (ADVIL) 200 MG tablet, Take 400 mg by mouth every 6 (six) hours as needed for headache., Disp: , Rfl:    lisinopril  (ZESTRIL ) 10 MG tablet, Take 1 tablet (10 mg total) by mouth daily., Disp: 90 tablet, Rfl: 1   metFORMIN  (GLUCOPHAGE -XR) 500 MG 24 hr tablet, Take 1 tablet (500 mg total) by mouth at bedtime., Disp: 90 tablet, Rfl: 1   Objective:     BP 130/80 (BP Location: Left Arm, Patient Position: Sitting, Cuff Size: Normal)   Pulse 75   Temp (!) 97.4 F (36.3 C) (Temporal)   Ht 5' 2 (1.575 m)   Wt 149 lb 3.2 oz (67.7 kg)   SpO2 99%   BMI 27.29 kg/m    Physical Exam Constitutional:      General: She is not in acute distress.    Appearance: Normal appearance. She is not ill-appearing, toxic-appearing or  diaphoretic.  HENT:     Head: Normocephalic and atraumatic.     Right Ear: External ear normal.     Left Ear: External ear normal.  Eyes:     General: No scleral icterus.       Right eye: No discharge.        Left eye: No discharge.     Extraocular Movements: Extraocular movements intact.     Conjunctiva/sclera: Conjunctivae normal.  Cardiovascular:     Rate and Rhythm: Normal rate and regular rhythm.  Pulmonary:     Effort: Pulmonary effort is normal. No respiratory distress.     Breath sounds: No wheezing or rales.  Skin:    General: Skin is warm and dry.  Neurological:     Mental Status: She is alert and oriented to person, place, and time.  Psychiatric:        Mood and Affect: Mood normal.        Behavior: Behavior normal.      No results found for any visits on 09/13/23.    The 10-year ASCVD risk score (Arnett DK, et al., 2019) is: 10%    Assessment & Plan:   Elevated LDL cholesterol level -     LDL cholesterol, direct -     Atorvastatin  Calcium ; Take 1 tablet (20 mg total) by mouth daily.  Dispense:  90 tablet; Refill: 3  Prediabetes -     metFORMIN  HCl ER; Take 1 tablet (500 mg total) by mouth at bedtime.  Dispense: 90 tablet; Refill: 1 -     Basic metabolic panel with GFR -     Hemoglobin A1c  Essential hypertension -     Lisinopril ; Take 1 tablet (10 mg total) by mouth daily.  Dispense: 90 tablet; Refill: 1 -     Basic metabolic panel with GFR  Immunization due -     Flu vaccine HIGH DOSE PF(Fluzone Trivalent)    Return in about 6 months (around 03/12/2024).  Have increased atorvastatin  to 20 mg for better control of LDL.  Advised patient to have the Shingrix vaccination.  Information was given.  She may have it through her pharmacy.  Elsie Sim Lent, MD  10/2 addendum: did not tolerate metformin  and discontinued it.

## 2023-09-25 DIAGNOSIS — E7800 Pure hypercholesterolemia, unspecified: Secondary | ICD-10-CM | POA: Diagnosis not present

## 2023-09-25 DIAGNOSIS — E875 Hyperkalemia: Secondary | ICD-10-CM | POA: Diagnosis not present

## 2023-09-25 DIAGNOSIS — R61 Generalized hyperhidrosis: Secondary | ICD-10-CM | POA: Diagnosis not present

## 2023-09-25 DIAGNOSIS — R7303 Prediabetes: Secondary | ICD-10-CM | POA: Diagnosis not present

## 2023-09-25 DIAGNOSIS — F4322 Adjustment disorder with anxiety: Secondary | ICD-10-CM | POA: Diagnosis not present

## 2023-09-25 DIAGNOSIS — R5383 Other fatigue: Secondary | ICD-10-CM | POA: Diagnosis not present

## 2023-09-25 DIAGNOSIS — Z8639 Personal history of other endocrine, nutritional and metabolic disease: Secondary | ICD-10-CM | POA: Diagnosis not present

## 2023-09-25 DIAGNOSIS — R030 Elevated blood-pressure reading, without diagnosis of hypertension: Secondary | ICD-10-CM | POA: Diagnosis not present

## 2023-10-01 NOTE — Telephone Encounter (Signed)
 Copied from CRM #[id number]. Topic: Clinical - Medical Advice >> Oct 01, 2023  9:30 AM Mesmerise C wrote: Reason for CRM: Patient would like to leave Dr. Berneta a message states she's been taking Metformin  on 9/11 has been a running stomach since the weekend she stopped and won't be taking it again and she's stopped hasn't had it happen since

## 2023-10-04 NOTE — Addendum Note (Signed)
 Addended by: BERNETA ELSIE LABOR on: 10/04/2023 12:48 PM   Modules accepted: Orders

## 2023-10-11 ENCOUNTER — Ambulatory Visit: Payer: Self-pay

## 2023-10-11 ENCOUNTER — Telehealth: Admitting: Internal Medicine

## 2023-10-11 ENCOUNTER — Encounter: Payer: Self-pay | Admitting: Internal Medicine

## 2023-10-11 DIAGNOSIS — F419 Anxiety disorder, unspecified: Secondary | ICD-10-CM | POA: Diagnosis not present

## 2023-10-11 MED ORDER — BUSPIRONE HCL 10 MG PO TABS: 10.0000 mg | ORAL_TABLET | Freq: Two times a day (BID) | ORAL | 2 refills | Status: AC | PRN
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
## 2023-10-11 NOTE — Telephone Encounter (Signed)
 FYI Only or Action Required?: Action required by provider: referral request.  Patient was last seen in primary care on 09/13/2023 by Berneta Elsie Sayre, MD.  Called Nurse Triage reporting Anxiety.  Symptoms began several weeks ago.  Interventions attempted: Prescription medications: citalopram.  Symptoms are: rapidly worsening.  Triage Disposition: See PCP When Office is Open (Within 3 Days)  Patient/caregiver understands and will follow disposition?: Yes    Copied from CRM #[id number]. Topic: Clinical - Red Word Triage >> Oct 11, 2023  9:05 AM Rosina BIRCH wrote: Reason for RMF:[id number] from driving anxiety and have not driven for three weeks  [phone] Reason for Disposition  MODERATE anxiety (e.g., persistent or frequent anxiety symptoms; interferes with sleep, school, or work)  Answer Assessment - Initial Assessment Questions Additional info: 1) Requesting referral for therapy. States it will need to be virtual visits. Would like ov to discuss.  2) PCP next visit is 10/23/23, scheduled virtual OV today with alternate provider.     1. CONCERN: Did anything happen that prompted you to call today?      Unable to drive due to anxiety 2. ANXIETY SYMPTOMS: Can you describe how you (your loved one; patient) have been feeling? (e.g., tense, restless, panicky, anxious, keyed up, overwhelmed, sense of impending doom).      Panic, out of control 3. ONSET: How long have you been feeling this way? (e.g., hours, days, weeks)     09/22/23 4. SEVERITY: How would you rate the level of anxiety? (e.g., 0 - 10; or mild, moderate, severe).     severe 5. FUNCTIONAL IMPAIRMENT: How have these feelings affected your ability to do daily activities? Have you had more difficulty than usual doing your normal daily activities? (e.g., getting better, same, worse; self-care, school, work, interactions)     Unable to drive. Feels she cannot control car. She is still able to work but needing  rides. She tried to go to [employer] this morning but needed to turn right around.  6. HISTORY: Have you felt this way before? Have you ever been diagnosed with an anxiety problem in the past? (e.g., generalized anxiety disorder, panic attacks, PTSD). If Yes, ask: How was this problem treated? (e.g., medicines, counseling, etc.)     yes 7. RISK OF HARM - SUICIDAL IDEATION: Do you ever have thoughts of hurting or killing yourself? If Yes, ask:  Do you have these feelings now? Do you have a plan on how you would do this?     denies 8. TREATMENT:  What has been done so far to treat this anxiety? (e.g., medicines, relaxation strategies). What has helped?     09/25/23 started citalopram rx by endocrinologist 9. THERAPIST: Do you have a counselor or therapist? If Yes, ask: What is their name?     No  10. POTENTIAL TRIGGERS: Do you drink caffeinated beverages (e.g., coffee, colas, teas), and how much daily? Do you drink alcohol or use any drugs? Have you started any new medicines recently?       driving 11. PATIENT SUPPORT: Who is with you now? Who do you live with? Do you have family or friends who you can talk to?        friends 12. OTHER SYMPTOMS: Do you have any other symptoms? (e.g., feeling depressed, trouble concentrating, trouble sleeping, trouble breathing, palpitations or fast heartbeat, chest pain, sweating, nausea, or diarrhea)       denies 13. PREGNANCY: Is there any chance you are pregnant? When was  your last menstrual period?  Protocols used: Anxiety and Panic Attack-A-AH

## 2023-10-22 ENCOUNTER — Ambulatory Visit: Payer: Self-pay

## 2023-10-22 NOTE — Telephone Encounter (Signed)
 FYI

## 2023-10-22 NOTE — Telephone Encounter (Signed)
 FYI Only or Action Required?: FYI only for provider.  Patient was last seen in primary care on 10/11/2023 by Billy Knee, FNP.  Called Nurse Triage reporting Anxiety.  Symptoms began several weeks ago.  Interventions attempted: Prescription medications: Buspar.  Symptoms are: unchanged.  Triage Disposition: Home Care  Patient/caregiver understands and will follow disposition?: Yes Reason for Disposition  MILD anxiety symptoms (e.g., anxiety symptoms are mild and intermittent; symptoms do not interfere with daily activities)  Answer Assessment - Initial Assessment Questions Stated tried to drive 3 minutes from the house to [employer] and could not even make it there. States she can sit in a lawnmower and mow her lawn, only. Patient was advised to wait 2-3 to see if symptoms improve, patient agrees to give it another week or 2.   1. CONCERN: Did anything happen that prompted you to call today?      Having anxiety when driving, given Buspar and states its not working  2. ANXIETY SYMPTOMS: Can you describe how you (your loved one; patient) have been feeling? (e.g., tense, restless, panicky, anxious, keyed up, overwhelmed, sense of impending doom).      Hot flash, legs feel like bricks, and feels like getting out of control.   3. ONSET: How long have you been feeling this way? (e.g., hours, days, weeks)     Anxiety when driving is new, since 0/76  4. SEVERITY: How would you rate the level of anxiety? (e.g., 0 - 10; or mild, moderate, severe).     Moderate to severe, cannot drive anymore  5. FUNCTIONAL IMPAIRMENT: How have these feelings affected your ability to do daily activities? Have you had more difficulty than usual doing your normal daily activities? (e.g., getting better, same, worse; self-care, school, work, interactions)     Yes, cannot drive  6. HISTORY: Have you felt this way before? Have you ever been diagnosed with an anxiety problem in the past? (e.g.,  generalized anxiety disorder, panic attacks, PTSD). If Yes, ask: How was this problem treated? (e.g., medicines, counseling, etc.)     No  7. TREATMENT:  What has been done so far to treat this anxiety? (e.g., medicines, relaxation strategies). What has helped?     Lexapro , Buspar  8. POTENTIAL TRIGGERS: Do you drink caffeinated beverages (e.g., coffee, colas, teas), and how much daily? Do you drink alcohol or use any drugs? Have you started any new medicines recently?       Driving  9. PATIENT SUPPORT: Who is with you now? Who do you live with? Do you have family or friends who you can talk to?        Friends  Protocols used: Anxiety and Panic Attack-A-AH    Copied from CRM #[id number]. Topic: Clinical - Red Word Triage >> Oct 22, 2023 10:02 AM Alfonso ORN wrote: Red Word that prompted transfer to Nurse Triage: gave pt given rx from [facility] and was told her how to take it and pharmacy told her otherwise, pt followed instructions of Billy but anxiety symptoms/panic attacks have not improved. Patient says she is still unable to drive.

## 2023-10-22 NOTE — Telephone Encounter (Signed)
Patient needs to follow up with PCP

## 2023-10-23 NOTE — Telephone Encounter (Signed)
 Pt scheduled for VV with PCP on 11/02/23.

## 2023-11-02 ENCOUNTER — Telehealth: Payer: Self-pay

## 2023-11-02 ENCOUNTER — Ambulatory Visit: Admitting: Family Medicine

## 2023-11-02 NOTE — Telephone Encounter (Signed)
 Pt scheduled an appt for this concern before the crm was placed for 11/15/23, I put her on the waiting list to be moved up.

## 2023-11-02 NOTE — Telephone Encounter (Signed)
 Copied from CRM [id number]. Topic: Referral - Request for Referral >> Nov 02, 2023 10:07 AM Alfonso ORN wrote: Did the patient discuss referral with their provider in the last year? No, awaiting appt to discuss anxiety   Appointment offered? No  Type of order/referral and detailed reason for visit: Therapist   Preference of office, provider, location: [facility] therapist   If referral order, have you been seen by this specialty before? No (If Yes, this issue or another issue? When? Where?  Can we respond through MyChart? Yes

## 2023-11-09 ENCOUNTER — Encounter: Payer: Self-pay | Admitting: Family Medicine

## 2023-11-09 ENCOUNTER — Ambulatory Visit (INDEPENDENT_AMBULATORY_CARE_PROVIDER_SITE_OTHER): Admitting: Family Medicine

## 2023-11-09 VITALS — BP 126/82 | HR 78 | Temp 98.6°F | Ht 62.0 in | Wt 145.8 lb

## 2023-11-09 DIAGNOSIS — F419 Anxiety disorder, unspecified: Secondary | ICD-10-CM

## 2023-11-09 MED ORDER — ESCITALOPRAM OXALATE 20 MG PO TABS: 20.0000 mg | ORAL_TABLET | Freq: Every day | ORAL | 1 refills | Status: AC

## 2023-11-09 NOTE — Progress Notes (Signed)
 Established Patient Office Visit   Subjective:  Patient ID: Vicki George, female    DOB: 01-23-1955  Age: 68 y.o. MRN: [id number]  Chief Complaint  Patient presents with   Anxiety    Pt states anxiety while driving has worsened. Pt states she is no longer driving.     Anxiety Symptoms include nervous/anxious behavior.     Encounter Diagnoses  Name Primary?   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  She continues with the citalopram 10 mg daily.  Seen recently and started on BuSpar 10 mg twice daily.  He has been helpful but continues to experience anxiety when she drives.  She is exercising by walking a mile daily.   Review of Systems  Constitutional: Negative.   HENT: Negative.    Eyes:  Negative for blurred vision, discharge and redness.  Respiratory: Negative.    Cardiovascular: Negative.   Gastrointestinal:  Negative for abdominal pain.  Genitourinary: Negative.   Musculoskeletal: Negative.  Negative for myalgias.  Skin:  Negative for rash.  Neurological:  Negative for tingling, loss of consciousness and weakness.  Endo/Heme/Allergies:  Negative for polydipsia.  Psychiatric/Behavioral:  The patient is nervous/anxious.       11/09/2023    9:25 AM 09/13/2023    9:37 AM 10/16/2022    1:22 PM  Depression screen PHQ 2/9  Decreased Interest 0 0 0  Down, Depressed, Hopeless 0 0 0  PHQ - 2 Score 0 0 0  Altered sleeping 0  3  Tired, decreased energy 0  1  Change in appetite 0  0  Feeling bad or failure about yourself  0  0  Trouble concentrating 0  0  Moving slowly or fidgety/restless 0  0  Suicidal thoughts 0  0  PHQ-9 Score 0  4   Difficult doing work/chores Not difficult at all  Not difficult at all     Data saved with a previous flowsheet row definition      Current Outpatient Medications:    escitalopram  (LEXAPRO ) 20 MG tablet, Take 1 tablet (20 mg total) by mouth  daily., Disp: 90 tablet, Rfl: 1   atorvastatin  (LIPITOR) 20 MG tablet, Take 1 tablet (20 mg total) by mouth daily., Disp: 90 tablet, Rfl: 3   busPIRone (BUSPAR) 10 MG tablet, Take 1 tablet (10 mg total) by mouth 2 (two) times daily as needed., Disp: 60 tablet, Rfl: 2   lisinopril  (ZESTRIL ) 10 MG tablet, Take 1 tablet (10 mg total) by mouth daily., Disp: 90 tablet, Rfl: 1   Objective:     BP 126/82 (BP Location: Left Arm, Patient Position: Sitting, Cuff Size: Normal)   Pulse 78   Temp 98.6 F (37 C) (Temporal)   Ht 5' 2 (1.575 m)   Wt 145 lb 12.8 oz (66.1 kg)   SpO2 100%   BMI 26.67 kg/m    Physical Exam Constitutional:      General: She is not in acute distress.    Appearance: Normal appearance. She is not ill-appearing, toxic-appearing or diaphoretic.  HENT:     Head: Normocephalic and atraumatic.     Right Ear: External ear normal.     Left Ear: External ear normal.  Eyes:     General: No scleral icterus.       Right eye: No discharge.        Left eye: No discharge.  Extraocular Movements: Extraocular movements intact.     Conjunctiva/sclera: Conjunctivae normal.  Pulmonary:     Effort: Pulmonary effort is normal. No respiratory distress.  Skin:    General: Skin is warm and dry.  Neurological:     Mental Status: She is alert and oriented to person, place, and time.  Psychiatric:        Mood and Affect: Mood normal.        Behavior: Behavior normal.      No results found for any visits on 11/09/23.    The 10-year ASCVD risk score (Arnett DK, et al., 2019) is: 9.4%    Assessment & Plan:   Anxiety disorder with panic attacks -     Escitalopram  Oxalate; Take 1 tablet (20 mg total) by mouth daily.  Dispense: 90 tablet; Refill: 1    Return in about 8 weeks (around 01/04/2024).  Increased escitalopram  to 20 mg daily.  Continue BuSpar.  Increase exercise to 30 minutes daily.  Information given on exercising to stay healthy and mindfulness based stress  relief.  Elsie Sim Lent, MD

## 2023-11-13 ENCOUNTER — Telehealth: Payer: Self-pay

## 2023-11-13 NOTE — Telephone Encounter (Signed)
 Appointment has been made 01/04/24

## 2023-11-15 ENCOUNTER — Ambulatory Visit: Admitting: Family Medicine

## 2023-12-04 DIAGNOSIS — E7800 Pure hypercholesterolemia, unspecified: Secondary | ICD-10-CM | POA: Diagnosis not present

## 2023-12-04 DIAGNOSIS — Z8639 Personal history of other endocrine, nutritional and metabolic disease: Secondary | ICD-10-CM | POA: Diagnosis not present

## 2023-12-04 DIAGNOSIS — F4001 Agoraphobia with panic disorder: Secondary | ICD-10-CM | POA: Diagnosis not present

## 2023-12-04 DIAGNOSIS — R030 Elevated blood-pressure reading, without diagnosis of hypertension: Secondary | ICD-10-CM | POA: Diagnosis not present

## 2023-12-04 DIAGNOSIS — R7303 Prediabetes: Secondary | ICD-10-CM | POA: Diagnosis not present

## 2023-12-12 DIAGNOSIS — F411 Generalized anxiety disorder: Secondary | ICD-10-CM | POA: Diagnosis not present

## 2023-12-12 DIAGNOSIS — F4001 Agoraphobia with panic disorder: Secondary | ICD-10-CM | POA: Diagnosis not present

## 2023-12-13 ENCOUNTER — Ambulatory Visit: Admitting: Family Medicine

## 2023-12-18 ENCOUNTER — Telehealth: Payer: Self-pay

## 2023-12-18 NOTE — Telephone Encounter (Signed)
 Copied from CRM #[id number]. Topic: Clinical - Medication Question >> Dec 18, 2023  9:12 AM Roselie BROCKS wrote: Reason for CRM: Patient is going to [place] and is asking if she can get some medication for Malaria  before she leaves patient is leaving on December 24th. ,she uses  [id number] - [place], [place] - [address] [place] [address] Phone: [phone] Fax: [phone]

## 2023-12-18 NOTE — Telephone Encounter (Signed)
 Contacted patient and provided her with the travel clinic number to get scheduled.

## 2023-12-18 NOTE — Telephone Encounter (Signed)
 Dr. Berneta,   I called patient to see what prescription does she need before going on vacation. Pt states she usually gets it back home in [place], but she wants to get it before she leaves. She state it is for Malaria. Please Advise.   LOV:11/09/2023

## 2024-01-04 ENCOUNTER — Ambulatory Visit: Admitting: Family Medicine

## 2024-03-11 ENCOUNTER — Ambulatory Visit: Admitting: Family Medicine
